# Patient Record
Sex: Female | Born: 1955
Health system: Southern US, Community
[De-identification: ages and names within clinical notes are randomized; demographics above are authoritative.]

## PROBLEM LIST (undated history)

## (undated) DIAGNOSIS — H9193 Unspecified hearing loss, bilateral: Secondary | ICD-10-CM

## (undated) HISTORY — PX: BREAST ENHANCEMENT SURGERY: SHX7

---

## 2019-06-07 ENCOUNTER — Encounter (HOSPITAL_COMMUNITY): Payer: Self-pay

## 2019-06-07 DIAGNOSIS — C210 Malignant neoplasm of anus, unspecified: Secondary | ICD-10-CM

## 2019-06-07 DIAGNOSIS — E785 Hyperlipidemia, unspecified: Secondary | ICD-10-CM

## 2019-06-07 HISTORY — PX: OSTOMY: SHX5997

## 2019-06-07 HISTORY — DX: Hyperlipidemia, unspecified: E78.5

## 2019-06-07 HISTORY — DX: Malignant neoplasm of anus, unspecified: C21.0

## 2019-06-10 ENCOUNTER — Inpatient Hospital Stay (HOSPITAL_COMMUNITY): Payer: BC Managed Care – PPO | Attending: Hematology | Admitting: Hematology

## 2019-06-10 ENCOUNTER — Encounter (HOSPITAL_COMMUNITY): Payer: Self-pay | Admitting: Hematology

## 2019-06-10 ENCOUNTER — Other Ambulatory Visit: Payer: Self-pay

## 2019-06-10 ENCOUNTER — Inpatient Hospital Stay (HOSPITAL_COMMUNITY): Payer: BC Managed Care – PPO

## 2019-06-10 VITALS — BP 112/70 | HR 73 | Temp 97.3°F | Resp 18 | Ht 67.25 in | Wt 144.2 lb

## 2019-06-10 DIAGNOSIS — R61 Generalized hyperhidrosis: Secondary | ICD-10-CM | POA: Insufficient documentation

## 2019-06-10 DIAGNOSIS — Z9221 Personal history of antineoplastic chemotherapy: Secondary | ICD-10-CM | POA: Insufficient documentation

## 2019-06-10 DIAGNOSIS — L03311 Cellulitis of abdominal wall: Secondary | ICD-10-CM | POA: Insufficient documentation

## 2019-06-10 DIAGNOSIS — C210 Malignant neoplasm of anus, unspecified: Secondary | ICD-10-CM | POA: Diagnosis present

## 2019-06-10 DIAGNOSIS — E785 Hyperlipidemia, unspecified: Secondary | ICD-10-CM | POA: Diagnosis not present

## 2019-06-10 DIAGNOSIS — Z7289 Other problems related to lifestyle: Secondary | ICD-10-CM | POA: Insufficient documentation

## 2019-06-10 DIAGNOSIS — K6389 Other specified diseases of intestine: Secondary | ICD-10-CM | POA: Diagnosis not present

## 2019-06-10 DIAGNOSIS — Z23 Encounter for immunization: Secondary | ICD-10-CM | POA: Insufficient documentation

## 2019-06-10 DIAGNOSIS — Z79890 Hormone replacement therapy (postmenopausal): Secondary | ICD-10-CM | POA: Insufficient documentation

## 2019-06-10 DIAGNOSIS — C7982 Secondary malignant neoplasm of genital organs: Secondary | ICD-10-CM

## 2019-06-10 DIAGNOSIS — Z87891 Personal history of nicotine dependence: Secondary | ICD-10-CM | POA: Diagnosis not present

## 2019-06-10 DIAGNOSIS — C211 Malignant neoplasm of anal canal: Secondary | ICD-10-CM | POA: Insufficient documentation

## 2019-06-10 DIAGNOSIS — L0889 Other specified local infections of the skin and subcutaneous tissue: Secondary | ICD-10-CM

## 2019-06-10 DIAGNOSIS — Z0000 Encounter for general adult medical examination without abnormal findings: Secondary | ICD-10-CM

## 2019-06-10 LAB — CBC WITH DIFFERENTIAL/PLATELET
Abs Immature Granulocytes: 0.01 10*3/uL (ref 0.00–0.07)
Basophils Absolute: 0 10*3/uL (ref 0.0–0.1)
Basophils Relative: 1 %
Eosinophils Absolute: 0.2 10*3/uL (ref 0.0–0.5)
Eosinophils Relative: 4 %
HCT: 39.5 % (ref 36.0–46.0)
Hemoglobin: 13.3 g/dL (ref 12.0–15.0)
Immature Granulocytes: 0 %
Lymphocytes Relative: 26 %
Lymphs Abs: 1.1 10*3/uL (ref 0.7–4.0)
MCH: 33.3 pg (ref 26.0–34.0)
MCHC: 33.7 g/dL (ref 30.0–36.0)
MCV: 99 fL (ref 80.0–100.0)
Monocytes Absolute: 0.4 10*3/uL (ref 0.1–1.0)
Monocytes Relative: 10 %
Neutro Abs: 2.6 10*3/uL (ref 1.7–7.7)
Neutrophils Relative %: 59 %
Platelets: 193 10*3/uL (ref 150–400)
RBC: 3.99 MIL/uL (ref 3.87–5.11)
RDW: 12.5 % (ref 11.5–15.5)
WBC: 4.3 10*3/uL (ref 4.0–10.5)
nRBC: 0 % (ref 0.0–0.2)

## 2019-06-10 LAB — COMPREHENSIVE METABOLIC PANEL
ALT: 17 U/L (ref 0–44)
AST: 18 U/L (ref 15–41)
Albumin: 4.3 g/dL (ref 3.5–5.0)
Alkaline Phosphatase: 53 U/L (ref 38–126)
Anion gap: 8 (ref 5–15)
BUN: 19 mg/dL (ref 8–23)
CO2: 26 mmol/L (ref 22–32)
Calcium: 8.9 mg/dL (ref 8.9–10.3)
Chloride: 101 mmol/L (ref 98–111)
Creatinine, Ser: 0.58 mg/dL (ref 0.44–1.00)
GFR calc Af Amer: >60 mL/min (ref >60)
GFR calc non Af Amer: >60 mL/min (ref >60)
Glucose, Bld: 93 mg/dL (ref 70–99)
Potassium: 4.1 mmol/L (ref 3.5–5.1)
Sodium: 135 mmol/L (ref 135–145)
Total Bilirubin: 0.8 mg/dL (ref 0.3–1.2)
Total Protein: 7.3 g/dL (ref 6.5–8.1)

## 2019-06-10 LAB — TROPONIN I (HIGH SENSITIVITY): Troponin I (High Sensitivity): 4 ng/L (ref <18)

## 2019-06-10 LAB — TSH: TSH: 1.597 u[IU]/mL (ref 0.350–4.500)

## 2019-06-10 MED ORDER — INFLUENZA VAC SPLIT QUAD 0.5 ML IM SUSY
0.5000 mL | PREFILLED_SYRINGE | Freq: Once | INTRAMUSCULAR | Status: AC
  Administered 2019-06-10: 13:00:00 0.5 mL via INTRAMUSCULAR
  Filled 2019-06-10: qty 0.5

## 2019-06-10 NOTE — Patient Instructions (Signed)
[place] at [facility] Discharge Instructions  You were seen today by Dr. Delton Coombes. He went over your history, family history and how you've been feeling lately. He will see you back according to your medical records for follow up.   Thank you for choosing [place] at [facility] to provide your oncology and hematology care.  To afford each patient quality time with our provider, please arrive at least 15 minutes before your scheduled appointment time.   If you have a lab appointment with the [facility] please come in thru the  Main Entrance and check in at the main information desk  You need to re-schedule your appointment should you arrive 10 or more minutes late.  We strive to give you quality time with our providers, and arriving late affects you and other patients whose appointments are after yours.  Also, if you no show three or more times for appointments you may be dismissed from the clinic at the providers discretion.     Again, thank you for choosing [facility].  Our hope is that these requests will decrease the amount of time that you wait before being seen by our physicians.       _____________________________________________________________  Should you have questions after your visit to [facility], please contact our office at [phone] between the hours of 8:00 a.m. and 4:30 p.m.  Voicemails left after 4:00 p.m. will not be returned until the following business day.  For prescription refill requests, have your pharmacy contact our office and allow 72 hours.    Cancer Center Support Programs:   > Cancer Support Group  2nd Tuesday of the month 1pm-2pm, Journey Room

## 2019-06-10 NOTE — Assessment & Plan Note (Signed)
1.  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 -Underwent colostomy reversal on 02/11/2019. -CT CAP on 03/18/2019 showed no evidence of metastatic disease in the chest.  Tiny hypodensities in the liver representing cysts.  No suspicious lymphadenopathy.  Surgical anastomosis present at the junction of the sigmoid colon and left descending colon. -She does not have any palpable adenopathy at this time. -I have recommended anoscopy every 6 to 12 months. -I have also recommended labs including CBC, CMP, troponin, ACTH, T3, free T4 and TSH as part of study protocol. -Follow-up appointments will be every 3 months for 2 years from study registration (which was March 2020), then every 6 months years 3-5. -We will schedule her next visit in 3 months with a PET CT scan in March 2021.  We will also try to obtain the protocol requirements for scans.  2.  Wound infection at the colostomy reversal site: -Left lower quadrant area is slightly erythematous.  There is very small clear discharge. -She reportedly stopped at the urgent care in [facility].  They initially put her on some antibiotic after obtaining cultures.  They called back yesterday and changed her antibiotic to Bactrim twice daily. -I am assuming this is MRSA.  She reports that she had 4 episodes of recurrent infection. -If there is no improvement, will consider referring to ID.

## 2019-06-10 NOTE — Progress Notes (Signed)
[facility] CONSULT NOTE  Patient Care Team: Patient, No Pcp Per as PCP - General (General Practice)  CHIEF COMPLAINTS/PURPOSE OF CONSULTATION:  Anal cancer follow-up.  HISTORY OF PRESENTING ILLNESS:  Courtney Gallegos 63 y.o. female is seen in consultation today for further work-up and management of squamous cell carcinoma of the anus.  She was diagnosed in October 2019 in [place], [place].  Symptoms at presentation include constipation, some minor bleeding.  She was treated with a combination chemoradiation therapy.  As part of her clinical trial, she also received 6 cycles of Opdivo as her tumor was PD-L1 positive.  She recently moved to [facility] because her daughter and grandkids live here.  She wants to establish care for her cancer history.  She denies any fevers.  She does have night sweats and uses testosterone injections.  She lost 25 pounds and was trying to lose weight.  While she was on her way to [facility], she had developed infection in the colostomy reversal area in the left anterior abdominal wall.  She stopped at an urgent care in [facility] and was given an antibiotic after cultures were taken.  She received a phone call from them yesterday and her antibiotic is changed to Bactrim DS twice daily.  She reports that she had the same skin infection about 4 times since the surgery.  She is currently retired.  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  She was an ex-smoker, who quit long time ago.  Family history is only significant for father who had tumor in kidney for years.  She does report some diarrhea since she is taking antibiotics.  Appetite is 100%.  Energy levels are present.  No pain reported.  MEDICAL HISTORY:  Past Medical History:  Diagnosis Date  . Anal cancer ([place]) 06/07/2019  . Hyperlipidemia 06/07/2019    SURGICAL HISTORY: Past Surgical History:  Procedure Laterality Date  . OSTOMY Left 06/07/2019     SOCIAL HISTORY: Social History   Socioeconomic History  . Marital status: Married    Spouse name: Not on file  . Number of children: 3  . Years of education: Not on file  . Highest education level: Not on file  Occupational History  . Occupation: Retired  Tobacco Use  . Smoking status: Never Smoker  . Smokeless tobacco: Never Used  Substance and Sexual Activity  . Alcohol use: Yes    Alcohol/week: 2.0 - 4.0 standard drinks    Types: 1 - 2 Glasses of wine, 1 - 2 Shots of liquor per week    Comment: daily   . Drug use: Never  . Sexual activity: Yes  Other Topics Concern  . Not on file  Social History Narrative  . Not on file   Social Determinants of Health   Financial Resource Strain: Low Risk   . Difficulty of Paying Living Expenses: Not hard at all  Food Insecurity: No Food Insecurity  . Worried About Charity fundraiser in the Last Year: Never true  . Ran Out of Food in the Last Year: Never true  Transportation Needs: No Transportation Needs  . Lack of Transportation (Medical): No  . Lack of Transportation (Non-Medical): No  Physical Activity: Inactive  . Days of Exercise per Week: 0 days  . Minutes of Exercise per Session: 0 min  Stress: No Stress Concern Present  . Feeling of Stress : Not at all  Social Connections: Slightly Isolated  .  Frequency of Communication with Friends and Family: More than three times a week  . Frequency of Social Gatherings with Friends and Family: Never  . Attends Religious Services: More than 4 times per year  . Active Member of Clubs or Organizations: No  . Attends Archivist Meetings: Never  . Marital Status: Married  Human resources officer Violence: Not At Risk  . Fear of Current or Ex-Partner: No  . Emotionally Abused: No  . Physically Abused: No  . Sexually Abused: No    FAMILY HISTORY: Family History  Problem Relation Age of Onset  . Cancer Father     ALLERGIES:  has no allergies on file.  MEDICATIONS:   Current Outpatient Medications  Medication Sig Dispense Refill  . atorvastatin (LIPITOR) 10 MG tablet Take 10 mg by mouth daily.    . calcium carbonate (OS-CAL - DOSED IN MG OF ELEMENTAL CALCIUM) 1250 (500 Ca) MG tablet Take 1 tablet by mouth.    . ergocalciferol (VITAMIN D2) 1.25 MG (50000 UT) capsule Take 50,000 Units by mouth once a week.    . Multiple Vitamin (MULTIVITAMIN WITH MINERALS) TABS tablet Take 1 tablet by mouth daily.    Marland Kitchen testosterone cypionate (DEPOTESTOSTERONE CYPIONATE) 200 MG/ML injection Inject into the muscle every 30 (thirty) days.     No current facility-administered medications for this visit.    REVIEW OF SYSTEMS:   Constitutional: Denies fevers, chills or abnormal night sweats Eyes: Denies blurriness of vision, double vision or watery eyes Ears, nose, mouth, throat, and face: Denies mucositis or sore throat Respiratory: Denies cough, dyspnea or wheezes Cardiovascular: Denies palpitation, chest discomfort or lower extremity swelling Gastrointestinal:  Denies nausea, heartburn or change in bowel habits Skin: Denies abnormal skin rashes Lymphatics: Denies new lymphadenopathy or easy bruising Neurological:Denies numbness, tingling or new weaknesses Behavioral/Psych: Mood is stable, no new changes  All other systems were reviewed with the patient and are negative.  PHYSICAL EXAMINATION: ECOG PERFORMANCE STATUS: 0 - Asymptomatic  Vitals:   06/10/19 1309  BP: 112/70  Pulse: 73  Resp: 18  Temp: (!) 97.3 F (36.3 C)  SpO2: 99%   Filed Weights   06/10/19 1309  Weight: 144 lb 3.2 oz (65.4 kg)    GENERAL:alert, no distress and comfortable SKIN: skin color, texture, turgor are normal, no rashes or significant lesions EYES: normal, conjunctiva are pink and non-injected, sclera clear OROPHARYNX:no exudate, no erythema and lips, buccal mucosa, and tongue normal  NECK: supple, thyroid normal size, non-tender, without nodularity LYMPH:  no palpable  lymphadenopathy in the cervical, axillary or inguinal LUNGS: clear to auscultation and percussion with normal breathing effort HEART: regular rate & rhythm and no murmurs and no lower extremity edema ABDOMEN:abdomen soft, non-tender and normal bowel sounds.  In the left quadrant, there is mild erythema and discharge at the injection site. Musculoskeletal:no cyanosis of digits and no clubbing  PSYCH: alert & oriented x 3 with fluent speech NEURO: no focal motor/sensory deficits  LABORATORY DATA:  I have reviewed the data as listed Lab Results  Component Value Date   WBC 4.3 06/10/2019   HGB 13.3 06/10/2019   HCT 39.5 06/10/2019   MCV 99.0 06/10/2019   PLT 193 06/10/2019     Chemistry      Component Value Date/Time   NA 135 06/10/2019 1422   K 4.1 06/10/2019 1422   CL 101 06/10/2019 1422   CO2 26 06/10/2019 1422   BUN 19 06/10/2019 1422   CREATININE 0.58 06/10/2019 1422  Component Value Date/Time   CALCIUM 8.9 06/10/2019 1422   ALKPHOS 53 06/10/2019 1422   AST 18 06/10/2019 1422   ALT 17 06/10/2019 1422   BILITOT 0.8 06/10/2019 1422       RADIOGRAPHIC STUDIES: I have personally reviewed the radiological images as listed and agreed with the findings in the report.  ASSESSMENT & PLAN:  Cancer of anal canal ([place]) 1.  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 -Underwent colostomy reversal on 02/11/2019. -CT CAP on 03/18/2019 showed no evidence of metastatic disease in the chest.  Tiny hypodensities in the liver representing cysts.  No suspicious lymphadenopathy.  Surgical anastomosis present at the junction of the sigmoid colon and left descending colon. -She does not have any palpable adenopathy at this  time. -I have recommended anoscopy every 6 to 12 months. -I have also recommended labs including CBC, CMP, troponin, ACTH, T3, free T4 and TSH as part of study protocol. -Follow-up appointments will be every 3 months for 2 years from study registration (which was March 2020), then every 6 months years 3-5. -We will schedule her next visit in 3 months with a PET CT scan in March 2021.  We will also try to obtain the protocol requirements for scans.  2.  Wound infection at the colostomy reversal site: -Left lower quadrant area is slightly erythematous.  There is very small clear discharge. -She reportedly stopped at the urgent care in [facility].  They initially put her on some antibiotic after obtaining cultures.  They called back yesterday and changed her antibiotic to Bactrim twice daily. -I am assuming this is MRSA.  She reports that she had 4 episodes of recurrent infection. -If there is no improvement, will consider referring to ID.  Orders Placed This Encounter  Procedures  . NM PET Image Restag (PS) Skull Base To Thigh    Standing Status:   Future    Standing Expiration Date:   06/09/2020    Order Specific Question:   ** REASON FOR EXAM (FREE TEXT)    Answer:   anal cancer    Order Specific Question:   If indicated for the ordered procedure, I authorize the administration of a radiopharmaceutical per Radiology protocol    Answer:   Yes    Order Specific Question:   Preferred imaging location?    Answer:   [facility]    Order Specific Question:   Radiology Contrast Protocol - do NOT remove file path    Answer:   [email or web address]  . CBC with Differential/Platelet    Standing Status:   Future    Standing Expiration Date:   06/09/2020  . Comprehensive metabolic panel    Standing Status:   Future    Standing Expiration Date:   06/09/2020  . TSH    Standing Status:   Future    Standing Expiration Date:   06/09/2020  . T3    Standing Status:    Future    Standing Expiration Date:   06/09/2020  . T4    Standing Status:   Future    Standing Expiration Date:   06/09/2020  . CBC with Differential/Platelet    Standing Status:   Future    Number of Occurrences:   1  Standing Expiration Date:   06/09/2020  . Comprehensive metabolic panel    Standing Status:   Future    Number of Occurrences:   1    Standing Expiration Date:   06/09/2020  . ACTH    Standing Status:   Future    Number of Occurrences:   1    Standing Expiration Date:   06/09/2020  . TSH    Standing Status:   Future    Number of Occurrences:   1    Standing Expiration Date:   06/09/2020  . T3    Standing Status:   Future    Number of Occurrences:   1    Standing Expiration Date:   06/09/2020  . T4    Standing Status:   Future    Number of Occurrences:   1    Standing Expiration Date:   06/09/2020    All questions were answered. The patient knows to call the clinic with any problems, questions or concerns.      Derek Jack, MD 06/10/2019 7:06 PM

## 2019-06-10 NOTE — Progress Notes (Signed)
I met with patient today during the visit with Dr. Delton Coombes.  I explained to her how I am involved in her care.  I provided my contact information so that she can contact me if she has any questions or concerns.  She voices appreciation for such great care.

## 2019-06-11 ENCOUNTER — Encounter (INDEPENDENT_AMBULATORY_CARE_PROVIDER_SITE_OTHER): Payer: Self-pay | Admitting: Gastroenterology

## 2019-06-11 LAB — T3: T3, Total: 125 ng/dL (ref 71–180)

## 2019-06-11 LAB — ACTH: C206 ACTH: 14.7 pg/mL (ref 7.2–63.3)

## 2019-06-11 LAB — T4: T4, Total: 5.7 ug/dL (ref 4.5–12.0)

## 2019-07-22 ENCOUNTER — Ambulatory Visit (INDEPENDENT_AMBULATORY_CARE_PROVIDER_SITE_OTHER): Payer: Self-pay | Admitting: Gastroenterology

## 2019-08-26 ENCOUNTER — Telehealth (HOSPITAL_COMMUNITY): Payer: Self-pay | Admitting: Hematology

## 2019-08-26 NOTE — Telephone Encounter (Signed)
Pt called upset wanting to speak to a Practice Admin regarding the bill she received from her visit on 06/10/2019. Pt states she was only here to meet Dr Delton Coombes ONLY. She states she did not know she was going to be ordered "all kinds of test and scans". She says she agreed to a flu shot and that was it. I asked pt why she allowed the lab tech to draw her labs? She stated she did not know what they were for and did not know how expensive they would be. She stated her new insurance plan is effective today and back in December he plan had a $8500 ded. I advised her that from what I could tell her balance is from her deductible. She questioned the facility charge and I explained that to her as well. Pt is upset I will escalate this to our AD-Haley Glennon Hamilton

## 2019-08-26 NOTE — Progress Notes (Signed)
[facility]  [address] [place], [place] [address] Phone: [phone]  Fax: [phone]   Clinic Day:  08/29/2019  Referring physician: No ref. provider found  Chief Complaint: Courtney Gallegos is a 64 y.o. female with stage III anal squamous cell carcinoma who is seen for new patient assessment.   HPI: The patient was diagnosed with anal squamous cell carcinoma of the perineum in 2019. She presented with constipation and minor rectal bleeding in 01/2018 during a physical exam with her OB/GYN.  Ultrasound on 02/15/2018 showed a 2.7 x 3.3 x 3.2 cm complex perineal mass. Biopsy on 04/09/2018 revealed moderately differentiated squamous cell carcinoma, P16+.   MRI of the pelvis on 03/25/2018 revealed a 3.2 cm palpable perineal mass invading the distal one third of the posterior vaginal wall extending to the introitus, the adjacent vaginal rectal perineum and the anterior anus extending in close proximity to the rectal anal junction craniocaudally to the introitus and appeared to extend from the 9:00 to 3 o'clock position.  Differential included anal carcinoma or vaginal carcinoma origin.  There was no adenopathy or metastatic disease.  PET scan on 05/17/2018 revealed focal anal rectal uptake consistent with her history of malignancy.  There was no hypermetabolic lymph nodes in the abdomen or pelvis.  There was no evidence of metastatic disease.  She underwent laparoscopic diverting colostomy on 06/05/2018.  She received 2 cycles of capecitabine + mitomycin (07/02/2018 - 07/30/2018) with concurrent radiation  (07/02/2018 - 08/10/2018).  She enrolled on clinical trial [id number] available through the [facility]:  A randomized phase 2 study of nivolumab after combined modality therapy in high risk anal cancer.  Treatment included nivolumab 480 mg IV q. 4 weeks for 6 cycles or observation.  She received 4 cycles of nivolumab (09/19/2018 -  12/12/2018).  She states post trial she undergoes PET scan every 6 months.  PET scan on 12/24/2018 showed no evidence of disease.  Fluoroscopy barium enema on 01/09/2019 showed no rectovaginal fistula.  There was diverticulosis involving the residual sigmoid colon.  She underwent colostomy reversal on 02/11/2019.   Chest, abdomen and pelvis CT on 03/18/2019 showed no evidence of disease recurrence.  Her next PET scan will be in 08/2019.    Post colostomy reversal, she had had 4 infections of her anterior left abdominal wall as her wound healed by secondary intention.  While on her way to [place], [place] from [place], [place], she developed an infection at the colostomy reversal site.  She stopped at an urgent care in [place], [place].  She was placed on antibiotics then switched to Bactrim on 06/09/2019 when cultures were available.  She was initially seen by Dr Derek Jack in oncology on 06/10/2019.  She decided to transfer care to the [facility].  Symptomatically, she feels "fine". She tolerated radiation and chemotherapy well.  She had no mouth sores, redness or tenderness of her hands and feet. She had lost some hair and is still loosing some hair. She had a change in taste; she did not like the taste of bread. She notes she had "chemo brain". Towards the end of radiation, she had some skin break downs on her perineum.  If she eats foods high in acidity she has a lot of irritation on her bottom.   She does not exercise as much; she feels tired. She would like to start back exercising. She denies any B symptoms. She had her first COVID vaccine on 08/22/2019 with  associated headaches and muscle aches.  She has a cough related to allergies. She has pain in her lower abdomen s/p colostomy reversal.  She would like her endoscopies performed by the surgeon recommended by her surgeon in [place].  She also wants her surgeon to take care of all of her GI related issues.   Her stool is narrow and  softer than ususal. She has small daily bowel movements. There is no blood in her stool but there is occasional mucus. She has no urinary concerns but she notes some leakage. She has no skin changes. She denies any neurological changes. She feel sore below her waist because she was unable to complete physical therapy due to the pandemic. She notes perineal muscle weakness.   She would like for her PET scan to be scheduled at the end on this month. She will be out of town from 09/08/2019 to 09/19/2019.   She received her first COVID-19 vaccine on 08/22/2019.   Past Medical History:  Diagnosis Date   Anal cancer ([place]) 06/07/2019   Hyperlipidemia 06/07/2019    Past Surgical History:  Procedure Laterality Date   OSTOMY Left 06/07/2019    Family History  Problem Relation Age of Onset   Cancer Father     Social History:  reports that she has never smoked. She has never used smokeless tobacco. She reports current alcohol use of about 2.0 - 4.0 standard drinks of alcohol per week. She reports that she does not use drugs. She is currently retired.  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 She was an ex-smoker, who quit long time ago when she was a teenager. She denies any exposure to radiation outside of treatment. Sh has no exposure to toxins. She drinks a couple times a week.  Patient used to live in [place], [place]. She lives in [place], [place]. Her daughter and grandchildren liver in [place], [place].  The patient is alone today.  Allergies: Not on File  Current Medications: Current Outpatient Medications  Medication Sig Dispense Refill   atorvastatin (LIPITOR) 10 MG tablet Take 10 mg by mouth daily.     calcium carbonate (OS-CAL - DOSED IN MG OF ELEMENTAL CALCIUM) 1250 (500 Ca) MG tablet Take 1 tablet by mouth.     ergocalciferol (VITAMIN D2) 1.25 MG (50000 UT) capsule Take 50,000 Units by mouth once a week.     Multiple Vitamin (MULTIVITAMIN WITH MINERALS) TABS tablet  Take 1 tablet by mouth daily.     testosterone cypionate (DEPOTESTOSTERONE CYPIONATE) 200 MG/ML injection Inject into the muscle every 30 (thirty) days.     No current facility-administered medications for this visit.    Review of Systems  Constitutional: Positive for malaise/fatigue. Negative for chills, diaphoresis, fever and weight loss.       Feels "fine".  HENT: Negative.  Negative for congestion, ear pain, hearing loss, nosebleeds, sinus pain, sore throat and tinnitus.   Eyes: Negative.  Negative for blurred vision, double vision and photophobia.  Respiratory: Positive for cough (allergies). Negative for hemoptysis, sputum production and shortness of breath.   Cardiovascular: Negative.  Negative for chest pain, palpitations and leg swelling.  Gastrointestinal: Negative for abdominal pain (lower abdomen discomfort), blood in stool, constipation, diarrhea, heartburn, melena, nausea and vomiting.       Stool is narrow and softer. Bowel movement daily. Occasional mucus in stool.  Genitourinary: Negative for dysuria, frequency, hematuria and urgency.       Perineal weakness.  Some urinary leakage.  Musculoskeletal: Positive for myalgias (muscle aches after COVID vaccine 1 week ago). Negative for back pain, joint pain and neck pain.  Skin: Negative for itching and rash (in rectal area depends on the food she eats).       Last staph infection at site of colostomy reversal in 05/2019.  Neurological: Negative for dizziness, tingling, sensory change, speech change, focal weakness, weakness and headaches (1 week ago related to COVID vaccine).       "Chemo brain".  Endo/Heme/Allergies: Positive for environmental allergies. Does not bruise/bleed easily.  Psychiatric/Behavioral: Negative.  Negative for depression and memory loss. The patient is not nervous/anxious and does not have insomnia.   All other systems reviewed and are negative.  Performance status (ECOG): 0-1  Vitals Blood pressure  108/64, pulse 73, temperature (!) 97.3 F (36.3 C), temperature source Tympanic, resp. rate 18, height 5' 7.25" (1.708 m), weight 147 lb 4.3 oz (66.8 kg), SpO2 96 %.   Physical Exam  Constitutional: She is oriented to person, place, and time. She appears well-developed and well-nourished. No distress.  HENT:  Head: Atraumatic.  Mouth/Throat: Oropharynx is clear and moist. No oropharyngeal exudate.  Long gray hair.  Mask.  Eyes: Pupils are equal, round, and reactive to light. Conjunctivae and EOM are normal. No scleral icterus.  Glasses.  Blue eyes.  Cardiovascular: Normal rate, regular rhythm and normal heart sounds.  No murmur heard. Pulmonary/Chest: Effort normal and breath sounds normal. No respiratory distress. She has no wheezes. She has no rales. She exhibits no tenderness.  Abdominal: Soft. Bowel sounds are normal. She exhibits no distension and no mass. There is no abdominal tenderness. There is no rebound and no guarding.  Left lower quadrant well healed s/p ostomy reversal.  Musculoskeletal:        General: No tenderness or edema. Normal range of motion.     Cervical back: Normal range of motion and neck supple.  Lymphadenopathy:       Head (right side): No preauricular, no posterior auricular and no occipital adenopathy present.       Head (left side): No preauricular, no posterior auricular and no occipital adenopathy present.    She has no cervical adenopathy.    She has no axillary adenopathy.       Right: No inguinal and no supraclavicular adenopathy present.       Left: No inguinal and no supraclavicular adenopathy present.  Neurological: She is alert and oriented to person, place, and time.  Skin: Skin is warm and dry. No rash noted. She is not diaphoretic. No erythema. No pallor.  Psychiatric: She has a normal mood and affect. Her behavior is normal. Judgment and thought content normal.  Nursing note and vitals reviewed.   Imaging studies: 02/15/2018:  Ultrasound  revealed a 2.7 x 3.3 x 3.2 cm complex perineal mass.  03/25/2018:  MRI of the pelvis revealed a 3.2 cm palpable perineal mass invading the distal one third of the posterior vaginal wall extending to the introitus, the adjacent vaginal rectal perineum and the anterior anus extending in close proximity to the rectal anal junction craniocaudally to the introitus and appeared to extend from the 9:00 to 3 o'clock position.  Differential included anal carcinoma or vaginal carcinoma origin.  There was no adenopathy or metastatic disease. 05/17/2018:  PET scan revealed focal anal rectal uptake consistent with her history of malignancy.  There was no hypermetabolic lymph nodes in the abdomen or pelvis.  There was no evidence of metastatic disease. 12/24/2018:  PET scan revealed no evidence of disease.   01/09/2019:  Fluoroscopy barium enema revealed no rectovaginal fistula.  There was diverticulosis involving the residual sigmoid colon.   03/18/2019:  Chest, abdomen and pelvis CT revealed no evidence of disease recurrence. ss or tenderness of her hands and feet. She had lost some hair and    No visits with results within 3 Day(s) from this visit.  Latest known visit with results is:  Appointment on 06/10/2019  Component Date Value Ref Range Status   T4, Total 06/10/2019 5.7  4.5 - 12.0 ug/dL Final   Comment: (NOTE) Performed At: [facility] [place], [place] [id number] Rush Farmer MD [id number]    T3, Total 06/10/2019 125  71 - 180 ng/dL Final   Comment: (NOTE) Performed At: [facility] [place], [place] [id number] Rush Farmer MD [id number]    TSH 06/10/2019 1.597  0.350 - 4.500 uIU/mL Final   Comment: Performed by a 3rd Generation assay with a functional sensitivity of <=0.01 uIU/mL. Performed at [facility], [address], [place], [place] [address]    Troponin I (High Sensitivity) 06/10/2019 4  <18 ng/L Final   Comment:  (NOTE) Elevated high sensitivity troponin I (hsTnI) values and significant  changes across serial measurements may suggest ACS but many other  chronic and acute conditions are known to elevate hsTnI results.  Refer to the "Links" section for chest pain algorithms and additional  guidance. Performed at [facility], [address], [place], [place] [address]    C206 ACTH 06/10/2019 14.7  7.2 - 63.3 pg/mL Final   Comment: (NOTE) ACTH reference interval for samples collected between 7 and 10 AM. Performed At: [facility] [place], [place] [id number] Rush Farmer MD [id number]    Sodium 06/10/2019 135  135 - 145 mmol/L Final   Potassium 06/10/2019 4.1  3.5 - 5.1 mmol/L Final   Chloride 06/10/2019 101  98 - 111 mmol/L Final   CO2 06/10/2019 26  22 - 32 mmol/L Final   Glucose, Bld 06/10/2019 93  70 - 99 mg/dL Final   BUN 06/10/2019 19  8 - 23 mg/dL Final   Creatinine, Ser 06/10/2019 0.58  0.44 - 1.00 mg/dL Final   Calcium 06/10/2019 8.9  8.9 - 10.3 mg/dL Final   Total Protein 06/10/2019 7.3  6.5 - 8.1 g/dL Final   Albumin 06/10/2019 4.3  3.5 - 5.0 g/dL Final   AST 06/10/2019 18  15 - 41 U/L Final   ALT 06/10/2019 17  0 - 44 U/L Final   Alkaline Phosphatase 06/10/2019 53  38 - 126 U/L Final   Total Bilirubin 06/10/2019 0.8  0.3 - 1.2 mg/dL Final   GFR calc non Af Amer 06/10/2019 >60  >60 mL/min Final   GFR calc Af Amer 06/10/2019 >60  >60 mL/min Final   Anion gap 06/10/2019 8  5 - 15 Final   Performed at [facility], [address] [place], [place] [address]   WBC 06/10/2019 4.3  4.0 - 10.5 K/uL Final   RBC 06/10/2019 3.99  3.87 - 5.11 MIL/uL Final   Hemoglobin 06/10/2019 13.3  12.0 - 15.0 g/dL Final   HCT 06/10/2019 39.5  36.0 - 46.0 % Final   MCV 06/10/2019 99.0  80.0 - 100.0 fL Final   MCH 06/10/2019 33.3  26.0 - 34.0 pg Final   MCHC 06/10/2019 33.7  30.0 - 36.0 g/dL Final   RDW 06/10/2019 12.5  11.5 -  15.5 % Final    Platelets 06/10/2019 193  150 - 400 K/uL Final   nRBC 06/10/2019 0.0  0.0 - 0.2 % Final   Neutrophils Relative % 06/10/2019 59  % Final   Neutro Abs 06/10/2019 2.6  1.7 - 7.7 K/uL Final   Lymphocytes Relative 06/10/2019 26  % Final   Lymphs Abs 06/10/2019 1.1  0.7 - 4.0 K/uL Final   Monocytes Relative 06/10/2019 10  % Final   Monocytes Absolute 06/10/2019 0.4  0.1 - 1.0 K/uL Final   Eosinophils Relative 06/10/2019 4  % Final   Eosinophils Absolute 06/10/2019 0.2  0.0 - 0.5 K/uL Final   Basophils Relative 06/10/2019 1  % Final   Basophils Absolute 06/10/2019 0.0  0.0 - 0.1 K/uL Final   Immature Granulocytes 06/10/2019 0  % Final   Abs Immature Granulocytes 06/10/2019 0.01  0.00 - 0.07 K/uL Final   Performed at [facility], [address], [place], [place] [address]    Assessment:  Courtney Gallegos is a 64 y.o. female with stage III anal squamous cell carcinoma s/p biopsy on 04/09/2018.  Pathology revealed a moderately differentiated squamous cell carcinoma, P16+.   MRI of the pelvis on 03/25/2018 revealed a 3.2 cm palpable perineal mass invading the distal one third of the posterior vaginal wall extending to the introitus, the adjacent vaginal rectal perineum and the anterior anus extending in close proximity to the rectal anal junction craniocaudally to the introitus and appeared to extend from the 9:00 to 3 o'clock position.   PET scan on 05/17/2018 revealed focal anal rectal uptake consistent with her history of malignancy.  There was no hypermetabolic lymph nodes in the abdomen or pelvis.  There was no evidence of metastatic disease.  She underwent laparoscopic diverting colostomy on 06/05/2018.  She received 2 cycles of capecitabine + mitomycin (07/02/2018 - 07/30/2018) with concurrent radiation  (07/02/2018 - 08/10/2018).  She underwent colostomy reversal on 02/11/2019.  She enrolled on clinical trial [id number] available through the [facility]:  A randomized  phase 2 study of nivolumab after combined modality therapy in high risk anal cancer.  She received 4 cycles of nivolumab (09/19/2018 - 12/12/2018).    Chest, abdomen and pelvis CT on 03/18/2019 revealed no evidence of disease recurrence.  Symptomatically, she feels "fine".  She notes some fatigue felt related to decreased exercise s/p the COVID-19 pandemic.  She describes "chemo brain". She has small daily bowel movements. She denies any blood in her stool but notes occasional mucus. She has some urinary leakage.  Exam reveals a well healed ostomy site.  She received her first COVID-19 vaccine on 08/22/2019.  Plan: 1.   Labs today:  CBC with diff, CMP, troponin, ACTH, TSH, free T4. 2.   Stage III anal squamous cell carcinoma  I reviewed her entire medical history, diagnosis and management of anal cell cancer.  We discussed her enrollment on clinical trial [id number].   I discussed follow-up with our clinical trials nurse Roger Shelter for clinical trial follow-up requirements.   She stated the she required a PET scan by the end of 08/2019.  We discussed need for follow-up endoscopies.   She requested follow-up with Dr Cinda Quest [phone]) recommended by her surgeon in [place].    Her surgeon in [place] is Dr Alda Lea [phone].    Her medical oncologist in [place] is Dr Venora Maples [phone] (#[phone]). 3.   Cognitive function s/p chemotherapy  Patient notes "chemo  brain".  Discuss follow-up with Roger Shelter regarding potential enrollment on the Remember study. 4.   Schedule PET scan on 09/20/2019. 5.   MD to talk to Roger Shelter re: current clinical trial and Remember study. 6.   MD to talk to Dr Cinda Quest (surgeon in [place]) re: endoscopies. 7.   RTC after PET scan (Doximity or WebEx) for MD assessment and review of studies.   I discussed the assessment and treatment plan with the patient.  The patient was provided an opportunity to ask questions and  all were answered.  The patient agreed with the plan and demonstrated an understanding of the instructions.  The patient was advised to call back if the symptoms worsen or if the condition fails to improve as anticipated.  I provided 33 minutes of face-to-face time during this this encounter and > 50% was spent counseling as documented under my assessment and plan.  An additional 15 minutes were spent reviewing her records.  Pattijo Juste C. Mike Gip, MD, PhD    08/29/2019, 3:16 PM  I, Selena Batten, am acting as scribe for [employer]. Mike Gip, MD, PhD.  I, Evonda Enge C. Mike Gip, MD, have reviewed the above documentation for accuracy and completeness, and I agree with the above.

## 2019-08-29 ENCOUNTER — Other Ambulatory Visit: Payer: Self-pay

## 2019-08-29 ENCOUNTER — Encounter: Payer: Self-pay | Admitting: Hematology and Oncology

## 2019-08-29 ENCOUNTER — Inpatient Hospital Stay: Payer: Medicare Other

## 2019-08-29 ENCOUNTER — Inpatient Hospital Stay: Payer: Medicare Other | Attending: Hematology and Oncology | Admitting: Hematology and Oncology

## 2019-08-29 VITALS — BP 108/64 | HR 73 | Temp 97.3°F | Resp 18 | Ht 67.25 in | Wt 147.3 lb

## 2019-08-29 DIAGNOSIS — Z87891 Personal history of nicotine dependence: Secondary | ICD-10-CM | POA: Insufficient documentation

## 2019-08-29 DIAGNOSIS — Z79899 Other long term (current) drug therapy: Secondary | ICD-10-CM | POA: Diagnosis not present

## 2019-08-29 DIAGNOSIS — C211 Malignant neoplasm of anal canal: Secondary | ICD-10-CM

## 2019-08-29 LAB — CBC WITH DIFFERENTIAL/PLATELET
Abs Immature Granulocytes: 0.01 10*3/uL (ref 0.00–0.07)
Basophils Absolute: 0 10*3/uL (ref 0.0–0.1)
Basophils Relative: 1 %
Eosinophils Absolute: 0.1 10*3/uL (ref 0.0–0.5)
Eosinophils Relative: 3 %
HCT: 39.2 % (ref 36.0–46.0)
Hemoglobin: 13.4 g/dL (ref 12.0–15.0)
Immature Granulocytes: 0 %
Lymphocytes Relative: 28 %
Lymphs Abs: 1.2 10*3/uL (ref 0.7–4.0)
MCH: 32.9 pg (ref 26.0–34.0)
MCHC: 34.2 g/dL (ref 30.0–36.0)
MCV: 96.3 fL (ref 80.0–100.0)
Monocytes Absolute: 0.4 10*3/uL (ref 0.1–1.0)
Monocytes Relative: 10 %
Neutro Abs: 2.6 10*3/uL (ref 1.7–7.7)
Neutrophils Relative %: 58 %
Platelets: 163 10*3/uL (ref 150–400)
RBC: 4.07 MIL/uL (ref 3.87–5.11)
RDW: 12.1 % (ref 11.5–15.5)
WBC: 4.4 10*3/uL (ref 4.0–10.5)
nRBC: 0 % (ref 0.0–0.2)

## 2019-08-29 LAB — COMPREHENSIVE METABOLIC PANEL
ALT: 22 U/L (ref 0–44)
AST: 20 U/L (ref 15–41)
Albumin: 4.4 g/dL (ref 3.5–5.0)
Alkaline Phosphatase: 43 U/L (ref 38–126)
Anion gap: 12 (ref 5–15)
BUN: 24 mg/dL — ABNORMAL HIGH (ref 8–23)
CO2: 24 mmol/L (ref 22–32)
Calcium: 9.1 mg/dL (ref 8.9–10.3)
Chloride: 103 mmol/L (ref 98–111)
Creatinine, Ser: 0.59 mg/dL (ref 0.44–1.00)
GFR calc Af Amer: >60 mL/min (ref >60)
GFR calc non Af Amer: >60 mL/min (ref >60)
Glucose, Bld: 104 mg/dL — ABNORMAL HIGH (ref 70–99)
Potassium: 3.9 mmol/L (ref 3.5–5.1)
Sodium: 139 mmol/L (ref 135–145)
Total Bilirubin: 0.4 mg/dL (ref 0.3–1.2)
Total Protein: 7.4 g/dL (ref 6.5–8.1)

## 2019-08-29 LAB — T4, FREE: Free T4: 0.7 ng/dL (ref 0.61–1.12)

## 2019-08-29 LAB — TROPONIN I (HIGH SENSITIVITY): Troponin I (High Sensitivity): 5 ng/L (ref <18)

## 2019-08-29 LAB — TSH: TSH: 1.456 u[IU]/mL (ref 0.350–4.500)

## 2019-09-02 ENCOUNTER — Encounter: Payer: Self-pay | Admitting: Hematology and Oncology

## 2019-09-09 ENCOUNTER — Inpatient Hospital Stay (HOSPITAL_COMMUNITY): Payer: BC Managed Care – PPO

## 2019-09-09 ENCOUNTER — Other Ambulatory Visit (HOSPITAL_COMMUNITY): Payer: Self-pay

## 2019-09-16 ENCOUNTER — Ambulatory Visit (HOSPITAL_COMMUNITY): Payer: Self-pay | Admitting: Hematology

## 2019-09-23 ENCOUNTER — Encounter
Admission: RE | Admit: 2019-09-23 | Discharge: 2019-09-23 | Disposition: A | Discharge status: Home / Self Care | Payer: Medicare Other | Source: Ambulatory Visit | Attending: Hematology and Oncology | Admitting: Hematology and Oncology

## 2019-09-23 ENCOUNTER — Other Ambulatory Visit: Payer: Self-pay

## 2019-09-23 DIAGNOSIS — C211 Malignant neoplasm of anal canal: Secondary | ICD-10-CM | POA: Diagnosis not present

## 2019-09-23 DIAGNOSIS — Z79899 Other long term (current) drug therapy: Secondary | ICD-10-CM | POA: Diagnosis not present

## 2019-09-23 LAB — GLUCOSE, CAPILLARY: Glucose-Capillary: 99 mg/dL (ref 70–99)

## 2019-09-23 MED ORDER — FLUDEOXYGLUCOSE F - 18 (FDG) INJECTION
7.6000 | Freq: Once | INTRAVENOUS | Status: AC | PRN
  Administered 2019-09-23: 7.98 via INTRAVENOUS

## 2019-09-24 NOTE — Progress Notes (Signed)
[facility]  [address], [place], [place] [address] Phone: [phone]  Fax: [phone]   Telemedicine Office Visit:  09/26/2019  Referring physician: No ref. provider found  I connected with Courtney Gallegos on 09/26/19 at 9:32 AM by videoconferencing and verified that I was speaking with the correct person using 2 identifiers.  The patient was at home.  I discussed the limitations, risk, security and privacy concerns of performing an evaluation and management service by videoconferencing and the availability of in person appointments.  I also discussed with the patient that there may be a patient responsible charge related to this service.  The patient expressed understanding and agreed to proceed.   Chief Complaint: Courtney Gallegos is a 64 y.o. female with stage III anal squamous cell carcinoma who is seen for re-assessment after recent PET scan.  HPI: The patient was last seen in the medical oncology clinic on 08/29/2019. At that time, she felt "fine". She noted some fatigue felt related to decreased exercise s/p the COVID-19 pandemic.  She described "chemo brain". She had small daily bowel movements. She denied any blood in her stool but noted occasional mucus. She had some urinary leakage. Exam revealed a well healed ostomy site.   Labs showed hematocrit 39.2, hematocrit 13.4, platelets 163,000, WBC 4,400. BUN was 24. Troponin I (high sensitivity) was 5. TSH was 1.456 with  free T4 0.70.   ACTH on 08/30/2019 was 19.4  PET scan on 09/23/2019 revealed no evidence of recurrent or metastatic disease.  There were hypermetabolic right/supraclavicular lymph nodes c/w COVID-19 vaccination 09/20/2019.  There was mild nonspecific hypermetabolism (SUV 3.2) within a 6 mm normal-sized subcarinal lymph node.   During the interim, the patient has felt "good". Patient received her last COVID-19 vaccine on 09/20/2019. She notes in order to continue participating in the study  she was told that she needed to be seen by providers at their facility and she did not want to to that.   She reports a good energy level. Patient continues to cough secondary to allergies. Her bowel are unchanged. Her urinary leakage is the same. Her thinking is fine but every once in a while she has trouble findings words during conversation.    Past Medical History:  Diagnosis Date  . Anal cancer ([place]) 06/07/2019  . Hyperlipidemia 06/07/2019    Past Surgical History:  Procedure Laterality Date  . OSTOMY Left 06/07/2019    Family History  Problem Relation Age of Onset  . Cancer Father     Social History:  reports that she has never smoked. She has never used smokeless tobacco. She reports current alcohol use of about 2.0 - 4.0 standard drinks of alcohol per week. She reports that she does not use drugs. She is currently retired.  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 She was an ex-smoker, who quit long time ago when she was a teenager. She denies any exposure to radiation outside of treatment. Sh has no exposure to toxins. She drinks a couple times a week.  Patient used to live in [place], [place]. She lives in [place], [place]. Her daughter and grandchildren liver in [place], [place].  The patient is accompanied by her husband, Mikki Santee, today.  Participants in the patient's visit and their role in the encounter included the patient, her husband, and Vito Berger, CMA, today.  The intake visit was provided by Vito Berger, CMA.   Allergies: Not on File  Current Medications: Current Outpatient  Medications  Medication Sig Dispense Refill  . atorvastatin (LIPITOR) 10 MG tablet Take 10 mg by mouth daily.    . calcium carbonate (OS-CAL - DOSED IN MG OF ELEMENTAL CALCIUM) 1250 (500 Ca) MG tablet Take 1 tablet by mouth.    . ergocalciferol (VITAMIN D2) 1.25 MG (50000 UT) capsule Take 50,000 Units by mouth once a week.    . Multiple Vitamin (MULTIVITAMIN WITH MINERALS)  TABS tablet Take 1 tablet by mouth daily.    Marland Kitchen testosterone cypionate (DEPOTESTOSTERONE CYPIONATE) 200 MG/ML injection Inject into the muscle every 30 (thirty) days.     No current facility-administered medications for this visit.    Review of Systems  Constitutional: Negative for chills, diaphoresis, fever, malaise/fatigue and weight loss.       Feels "good". Good energy level.  HENT: Negative.  Negative for congestion, ear pain, hearing loss, nosebleeds, sinus pain, sore throat and tinnitus.   Eyes: Negative.  Negative for blurred vision, double vision and photophobia.  Respiratory: Positive for cough (allergies). Negative for hemoptysis, sputum production and shortness of breath.   Cardiovascular: Negative.  Negative for chest pain, palpitations and leg swelling.  Gastrointestinal: Negative for abdominal pain, blood in stool, constipation, diarrhea, heartburn, melena, nausea and vomiting.       Stool is narrow and softer. Bowel movement daily. Occasional mucus in stool.  Genitourinary: Negative for dysuria, frequency, hematuria and urgency.       Perineal weakness.  Some urinary leakage.  Musculoskeletal: Negative for back pain, joint pain, myalgias and neck pain.  Skin: Negative for itching and rash (in rectal area depends on the food she eats).       Last staph infection at site of colostomy reversal in 05/2019.  Neurological: Negative for dizziness, tingling, sensory change, speech change, focal weakness, weakness and headaches.       "Chemo brain".  Endo/Heme/Allergies: Positive for environmental allergies. Does not bruise/bleed easily.  Psychiatric/Behavioral: Negative.  Negative for depression and memory loss. The patient is not nervous/anxious and does not have insomnia.   All other systems reviewed and are negative.  Performance status (ECOG): 1  Physical Exam Nursing note reviewed.  Constitutional:      General: She is not in acute distress.    Appearance: She is  well-developed and well-nourished. She is not diaphoretic.  HENT:      Comments: Long gray hair pulled back. Eyes:     General: No scleral icterus.    Extraocular Movements: EOM normal.     Conjunctiva/sclera: Conjunctivae normal.     Comments: Glasses.  Blue eyes.  Neurological:     Mental Status: She is alert and oriented to person, place, and time.  Psychiatric:        Mood and Affect: Mood and affect normal.        Behavior: Behavior normal.        Thought Content: Thought content normal.        Judgment: Judgment normal.    Imaging studies: 02/15/2018:  Ultrasound revealed a 2.7 x 3.3 x 3.2 cm complex perineal mass.  03/25/2018:  MRI of the pelvis revealed a 3.2 cm palpable perineal mass invading the distal one third of the posterior vaginal wall extending to the introitus, the adjacent vaginal rectal perineum and the anterior anus extending in close proximity to the rectal anal junction craniocaudally to the introitus and appeared to extend from the 9:00 to 3 o'clock position.  Differential included anal carcinoma or vaginal carcinoma origin.  There  was no adenopathy or metastatic disease. 05/17/2018:  PET scan revealed focal anal rectal uptake consistent with her history of malignancy.  There was no hypermetabolic lymph nodes in the abdomen or pelvis.  There was no evidence of metastatic disease. 12/24/2018:  PET scan revealed no evidence of disease.   01/09/2019:  Fluoroscopy barium enema revealed no rectovaginal fistula.  There was diverticulosis involving the residual sigmoid colon.   03/18/2019:  Chest, abdomen and pelvis CT revealed no evidence of disease recurrence. ss or tenderness of her hands and feet. She had lost some hair and  09/23/2019:  PET scan revealed no evidence of recurrent or metastatic disease.  There were hypermetabolic right/supraclavicular lymph nodes c/w COVID-19 vaccination 09/20/2019.  There was mild nonspecific hypermetabolism (SUV 3.2) within a  6 mm  normal-sized subcarinal lymph node.   No visits with results within 3 Day(s) from this visit.  Latest known visit with results is:  Hospital Outpatient Visit on 09/23/2019  Component Date Value Ref Range Status  . Glucose-Capillary 09/23/2019 99  70 - 99 mg/dL Final   Glucose reference range applies only to samples taken after fasting for at least 8 hours.    Assessment:  Courtney Gallegos is a 64 y.o. female with stage III anal squamous cell carcinoma s/p biopsy on 04/09/2018.  Pathology revealed a moderately differentiated squamous cell carcinoma, P16+.   MRI of the pelvis on 03/25/2018 revealed a 3.2 cm palpable perineal mass invading the distal one third of the posterior vaginal wall extending to the introitus, the adjacent vaginal rectal perineum and the anterior anus extending in close proximity to the rectal anal junction craniocaudally to the introitus and appeared to extend from the 9:00 to 3 o'clock position.   PET scan on 05/17/2018 revealed focal anal rectal uptake consistent with her history of malignancy.  There was no hypermetabolic lymph nodes in the abdomen or pelvis.  There was no evidence of metastatic disease.  She underwent laparoscopic diverting colostomy on 06/05/2018.  She received 2 cycles of capecitabine + mitomycin (07/02/2018 - 07/30/2018) with concurrent radiation  (07/02/2018 - 08/10/2018).  She underwent colostomy reversal on 02/11/2019.  She enrolled on clinical trial [id number] available through the [facility]:  A randomized phase 2 study of nivolumab after combined modality therapy in high risk anal cancer.  She received 4 cycles of nivolumab (09/19/2018 - 12/12/2018).    Chest, abdomen and pelvis CT on 03/18/2019 revealed no evidence of disease recurrence.  PET scan on 09/23/2019 revealed no evidence of recurrent or metastatic disease.  There were hypermetabolic right/supraclavicular lymph nodes c/w COVID-19 vaccination on 09/20/2019.  There was  mild nonspecific hypermetabolism (SUV 3.2) within a  6 mm subcarinal lymph node.  Patient received her last COVID-19 vaccine on 09/20/2019.  Symptomatically, she feels "good".  Plan: 1.   Stage III anal squamous cell carcinoma  She is s/p diverting colostomy and 2 cycles of capecitabine + mitomycin with concurrent radiation.  She is s/p colostomy reversal.  She enrolled on clinical trial [id number] and is s/p 4 cycles of nivolumab.  PET scan on 09/23/2019 was personally reviewed.  Agree with radiology interpretation.   There was no evidence of recurrent or metastatic disease.     There were hypermetabolic right/supraclavicular lymph nodes c/w COVID-19 vaccination.    There was mild nonspecific hypermetabolism within a normal-sized subcarinal lymph node.    Copy of PET scan to patient.  Clinical trials nurse Roger Shelter for clinical trial follow-up requirements.  She requires follow-up endoscopy.     She initially requested follow-up with Dr Cinda Quest [phone]) recommended by her surgeon in [place].   Patient wishes to have endoscopy performed locally.  Referral to GI (Dr Allen Norris) for anoscopy/proctoscopy, 2.   Cognitive function s/p chemotherapy  Patient notes "chemo brain".  Consider enrollment on the Remember study. 3.   RN to talk to Dr Venora Maples [phone] (#[phone]; triage ext [phone]) re: follow-up studies (sending information for clinical trial). 4.   RTC in 3 months for MD assess, labs (CBC with diff, CMP), and review of endoscopy.  I discussed the assessment and treatment plan with the patient.  The patient was provided an opportunity to ask questions and all were answered.  The patient agreed with the plan and demonstrated an understanding of the instructions.  The patient was advised to call back if the symptoms worsen or if the condition fails to improve as anticipated.  I provided 16 minutes (9:32 AM - 9:48 AM) of face-to-face time during this encounter.  An additional 5-6  minutes were spent reviewing her chart (Epic and Care Everywhere) including notes, labs, and imaging studies. [facility].   Lyncoln Maskell C. Mike Gip, MD, PhD    09/26/2019, 9:32 AM  I, Selena Batten, am acting as a scribe for Lequita Asal, MD.  I, Dunreith Mike Gip, MD, have reviewed the above documentation for accuracy and completeness, and I agree with the above.

## 2019-09-25 ENCOUNTER — Inpatient Hospital Stay: Payer: Medicare Other | Admitting: Hematology and Oncology

## 2019-09-25 ENCOUNTER — Encounter: Payer: Self-pay | Admitting: Hematology and Oncology

## 2019-09-25 NOTE — Progress Notes (Signed)
No new changes noted today. The patient name and DOB has been verified by phone today. 

## 2019-09-26 ENCOUNTER — Telehealth: Payer: Self-pay

## 2019-09-26 ENCOUNTER — Inpatient Hospital Stay: Payer: Medicare Other | Attending: Hematology and Oncology | Admitting: Hematology and Oncology

## 2019-09-26 ENCOUNTER — Encounter: Payer: Self-pay | Admitting: Hematology and Oncology

## 2019-09-26 DIAGNOSIS — C211 Malignant neoplasm of anal canal: Secondary | ICD-10-CM

## 2019-09-26 NOTE — Telephone Encounter (Signed)
VM left with Dr. Venora Maples team requesting information regarding clinical trial that patient is currently participating in. Patient has informed us that she was told facility is unable to accept any of our results for the clinical trial and that she would need to travel back to out of state facility to keep participating. Attempted to find out if we can fax over results so that she may continue to participate in program and if not, patient has informed Dr. Mike Gip she does not wish to continue in clinical trial because she is unable to travel to facility. Callback number provided.   Contact information for Dr. Venora Maples clinical team: [phone], Press 1 to enter extension, Ext. [phone]

## 2019-10-10 ENCOUNTER — Telehealth: Payer: Self-pay

## 2019-10-10 NOTE — Telephone Encounter (Signed)
Spoke with receptionist with Dr. Venora Maples' office. Informed him this is second attempt reaching out to Dr. Venora Maples' nurse. Receptionist states a note will be sent to nurse to call back.   This is regarding clinical trial that patient is currently participating in. Patient has informed us that she was told facility is unable to accept any of our results for the clinical trial and that she would need to travel back to out of state facility to keep participating. Can we fax over results so that she may continue to participate in program and if not, patient has informed Dr. Mike Gip she does not wish to continue in clinical trial because she is unable to travel to facility.   Contact information for Dr. Venora Maples clinical team: [phone], Press 1 to enter extension, Ext. [phone]

## 2019-10-25 ENCOUNTER — Other Ambulatory Visit (HOSPITAL_COMMUNITY): Payer: Self-pay | Admitting: Internal Medicine

## 2019-10-25 DIAGNOSIS — Z1231 Encounter for screening mammogram for malignant neoplasm of breast: Secondary | ICD-10-CM

## 2019-11-04 ENCOUNTER — Telehealth: Payer: Self-pay | Admitting: Adult Health

## 2019-11-04 NOTE — Telephone Encounter (Signed)

## 2019-11-05 ENCOUNTER — Other Ambulatory Visit (HOSPITAL_COMMUNITY)
Admission: RE | Admit: 2019-11-05 | Discharge: 2019-11-05 | Disposition: A | Discharge status: Home / Self Care | Payer: Medicare Other | Source: Ambulatory Visit | Attending: Adult Health | Admitting: Adult Health

## 2019-11-05 ENCOUNTER — Ambulatory Visit (INDEPENDENT_AMBULATORY_CARE_PROVIDER_SITE_OTHER): Payer: Medicare Other | Admitting: Adult Health

## 2019-11-05 ENCOUNTER — Encounter: Payer: Self-pay | Admitting: Adult Health

## 2019-11-05 VITALS — BP 106/70 | HR 73 | Ht 67.5 in | Wt 152.5 lb

## 2019-11-05 DIAGNOSIS — Z01419 Encounter for gynecological examination (general) (routine) without abnormal findings: Secondary | ICD-10-CM | POA: Diagnosis present

## 2019-11-05 DIAGNOSIS — N952 Postmenopausal atrophic vaginitis: Secondary | ICD-10-CM | POA: Diagnosis not present

## 2019-11-05 DIAGNOSIS — Z1212 Encounter for screening for malignant neoplasm of rectum: Secondary | ICD-10-CM | POA: Diagnosis not present

## 2019-11-05 DIAGNOSIS — Z1272 Encounter for screening for malignant neoplasm of vagina: Secondary | ICD-10-CM | POA: Diagnosis not present

## 2019-11-05 DIAGNOSIS — Z1211 Encounter for screening for malignant neoplasm of colon: Secondary | ICD-10-CM | POA: Insufficient documentation

## 2019-11-05 DIAGNOSIS — Z85048 Personal history of other malignant neoplasm of rectum, rectosigmoid junction, and anus: Secondary | ICD-10-CM | POA: Diagnosis not present

## 2019-11-05 DIAGNOSIS — Z01411 Encounter for gynecological examination (general) (routine) with abnormal findings: Secondary | ICD-10-CM

## 2019-11-05 DIAGNOSIS — Z1151 Encounter for screening for human papillomavirus (HPV): Secondary | ICD-10-CM | POA: Insufficient documentation

## 2019-11-05 DIAGNOSIS — Z124 Encounter for screening for malignant neoplasm of cervix: Secondary | ICD-10-CM

## 2019-11-05 LAB — HEMOCCULT GUIAC POC 1CARD (OFFICE): Fecal Occult Blood, POC: NEGATIVE

## 2019-11-05 MED ORDER — PREMARIN 0.625 MG/GM VA CREA
TOPICAL_CREAM | VAGINAL | 1 refills | Status: DC
Start: 2019-11-05 — End: 2019-12-03

## 2019-11-05 NOTE — Progress Notes (Signed)
Patient ID: Courtney Gallegos, female   DOB: 03/19/1956, 64 y.o.   MRN: [id number] History of Present Illness:  Courtney Gallegos is a 64 year old white female, with significant other, PM in for pelvic and pap,she is a new pt, referred by Dr Willey Blade. She had anal cancer las year and had chemo and radiation.  She moved here from the [facility].  PCP is Dr Willey Blade.   Current Medications, Allergies, Past Medical History, Past Surgical History, Family History and Social History were reviewed in [employer] record.     Review of Systems: Has discomfort with sex Has hot flashes and night sweats some now, has used testosterone injection in the past  Will lose stool at times, sp radiation for anal cancer Reviewed past medical,surgical, social and family history. Reviewed medications and allergies.     Physical Exam:BP 106/70 (BP Location: Left Arm, Patient Position: Sitting, Cuff Size: Normal)   Pulse 73   Ht 5' 7.5" (1.715 m)   Wt 152 lb 8 oz (69.2 kg)   BMI 23.53 kg/m  General:  Well developed, well nourished, no acute distress Skin:  Warm and dry Lungs; Clear to auscultation bilaterally Cardiovascular: Regular rate and rhythm Pelvic:  External genitalia is normal in appearance, no lesions.  The vagina is atrophic, pale and dry, with loss of rugae. Urethra has no lesions or masses. The cervix is smooth, pap with high risk HPV 16/18 genotyping perofmed.  Uterus is felt to be normal size, shape, and contour.  No adnexal masses or tenderness noted.Bladder is non tender, no masses felt. Rectal: Good sphincter tone, no polyps, or hemorrhoids felt.  Hemoccult negative. Psych:  No mood changes, alert and cooperative,seems happy AA is 5 Fall risk is low PHQ 9 score is 0.  Impression and Plan: 1. Routine cervical smear Pap sent  2. Encounter for gynecological examination with Papanicolaou smear of cervix Pap sent Physical with PCP Pap in 3 years if normal   3. Screening for colorectal  cancer Colonoscopy in near future  4. Vaginal atrophy Will try PVC Meds ordered this encounter  Medications  . conjugated estrogens (PREMARIN) vaginal cream    Sig: Use 0.5 gm in vagina daily for 2 weeks, then 2 x weekly    Dispense:  42.5 g    Refill:  1    Order Specific Question:   Supervising Provider    Answer:   EURE, LUTHER H [[id number]]    5. History of anal cancer

## 2019-11-07 ENCOUNTER — Ambulatory Visit (HOSPITAL_COMMUNITY)
Admission: RE | Admit: 2019-11-07 | Discharge: 2019-11-07 | Disposition: A | Discharge status: Home / Self Care | Payer: Medicare Other | Source: Ambulatory Visit | Attending: Internal Medicine | Admitting: Internal Medicine

## 2019-11-07 ENCOUNTER — Ambulatory Visit (INDEPENDENT_AMBULATORY_CARE_PROVIDER_SITE_OTHER): Payer: Medicare Other | Admitting: Gastroenterology

## 2019-11-07 ENCOUNTER — Other Ambulatory Visit: Payer: Self-pay

## 2019-11-07 ENCOUNTER — Telehealth: Payer: Self-pay | Admitting: Adult Health

## 2019-11-07 ENCOUNTER — Encounter: Payer: Self-pay | Admitting: Gastroenterology

## 2019-11-07 VITALS — BP 113/67 | HR 83 | Temp 98.1°F | Ht 67.5 in | Wt 152.0 lb

## 2019-11-07 DIAGNOSIS — Z1231 Encounter for screening mammogram for malignant neoplasm of breast: Secondary | ICD-10-CM | POA: Diagnosis present

## 2019-11-07 DIAGNOSIS — C211 Malignant neoplasm of anal canal: Secondary | ICD-10-CM | POA: Diagnosis not present

## 2019-11-07 LAB — CYTOLOGY - PAP
Comment: NEGATIVE
Diagnosis: NEGATIVE
High risk HPV: NEGATIVE

## 2019-11-07 NOTE — Telephone Encounter (Signed)
Left message that pap was negative for malignancy and HPV 

## 2019-11-07 NOTE — Progress Notes (Signed)
Gastroenterology Consultation  Referring Provider:     Lequita Asal, MD Primary Care Physician:  Asencion Noble, MD Primary Gastroenterologist:  Dr. Allen Norris     Reason for Consultation:     History of anal cancer        HPI:   Courtney Gallegos is a 64 y.o. y/o female referred for consultation & management of history of anal cancer by Dr. Asencion Noble, MD.  This patient comes in today after being sent by oncology for history of anal cancer.  The patient was treated with chemotherapy and radiation on the [facility] and then moved here.  The patient's treatment was approximately a year ago.  The patient had a PET scan on March 29 of this year that showed:  IMPRESSION: 1. No evidence of recurrent or metastatic disease. 2. Hypermetabolic right/supraclavicular lymph nodes, consistent with COVID-19 vaccination 09/20/2019. 3. Mild nonspecific hypermetabolism within a normal-sized subcarinal lymph node. 4.  Aortic atherosclerosis  It was recommended that the patient have a colonoscopy due to her history of anal cancer.  The patient denies any rectal bleeding change in bowel habits nausea vomiting fevers or chills.  The patient does report that she had a colonoscopy right before the treatment for her anal cancer and also reports that she believes she had one the year prior to that. The patient is very pleasant but questioning if she really needs this because she had a PET scan that was negative.  She feels that she has been to see only doctors and cannot do so much.  She denies any family history of colon cancer or colon polyps.  Past Medical History:  Diagnosis Date  . Anal cancer ([place]) 06/07/2019  . Hyperlipidemia 06/07/2019    Past Surgical History:  Procedure Laterality Date  . OSTOMY Left 06/07/2019    Prior to Admission medications   Medication Sig Start Date End Date Taking? Authorizing Provider  BIOTIN PO Take by mouth.    [provider]  calcium carbonate (OS-CAL - DOSED  IN MG OF ELEMENTAL CALCIUM) 1250 (500 Ca) MG tablet Take 1 tablet by mouth.    [provider]  conjugated estrogens (PREMARIN) vaginal cream Use 0.5 gm in vagina daily for 2 weeks, then 2 x weekly 11/05/19   Derrek Monaco A, NP  ergocalciferol (VITAMIN D2) 1.25 MG (50000 UT) capsule Take 50,000 Units by mouth once a week.    [provider]  Multiple Vitamin (MULTIVITAMIN WITH MINERALS) TABS tablet Take 1 tablet by mouth daily.    [provider]  rosuvastatin (CRESTOR) 5 MG tablet Take 5 mg by mouth daily. 10/09/19   [provider]    Family History  Problem Relation Age of Onset  . ALS Mother   . Cancer Father      Social History   Tobacco Use  . Smoking status: Never Smoker  . Smokeless tobacco: Never Used  Substance Use Topics  . Alcohol use: Yes    Alcohol/week: 2.0 - 4.0 standard drinks    Types: 1 - 2 Glasses of wine, 1 - 2 Shots of liquor per week  . Drug use: Never    Allergies as of 11/07/2019  . (No Known Allergies)    Review of Systems:    All systems reviewed and negative except where noted in HPI.   Physical Exam:  There were no vitals taken for this visit. No LMP recorded. Patient has had an ablation. General:   Alert,  Well-developed, well-nourished,  pleasant and cooperative in NAD Head:  Normocephalic and atraumatic. Eyes:  Sclera clear, no icterus.   Conjunctiva pink. Ears:  Normal auditory acuity. Neck:  Supple; no masses or thyromegaly. Lungs:  Respirations even and unlabored.  Clear throughout to auscultation.   No wheezes, crackles, or rhonchi. No acute distress. Heart:  Regular rate and rhythm; no murmurs, clicks, rubs, or gallops. Abdomen:  Normal bowel sounds.  No bruits.  Soft, non-tender and non-distended without masses, hepatosplenomegaly or hernias noted.  No guarding or rebound tenderness.  Negative Carnett sign.   Rectal:  Deferred.  Pulses:  Normal pulses noted. Extremities:  No clubbing or edema.  No  cyanosis. Neurologic:  Alert and oriented x3;  grossly normal neurologically. Skin:  Intact without significant lesions or rashes.  No jaundice. Lymph Nodes:  No significant cervical adenopathy. Psych:  Alert and cooperative. Normal mood and affect.  Imaging Studies: No results found.  Assessment and Plan:   Courtney Gallegos is a 64 y.o. y/o female who comes in today with a history of anal cancer and treatment a year ago with chemotherapy radiation and she also says she was treated with immunotherapy.  She states that she has been given a clean bill of health and that her PET scan was negative.  Therefore she states that she does not want to undergo a repeat colonoscopy  the patient has been told about the recommendations and that she should be followed closely due to her history of cancer.  The patient states that she will see what the next PET scan shows and will think about repeating the colonoscopy in September.  The patient has been told about the risks and benefits of the procedure.  The patient has been explained the plan agrees with it.    [name], MD. Marval Regal    Note: This dictation was prepared with Dragon dictation along with smaller phrase technology. Any transcriptional errors that result from this process are unintentional.

## 2019-11-19 ENCOUNTER — Other Ambulatory Visit (HOSPITAL_COMMUNITY): Payer: Self-pay | Admitting: Internal Medicine

## 2019-11-19 ENCOUNTER — Inpatient Hospital Stay
Admission: RE | Admit: 2019-11-19 | Discharge: 2019-11-19 | Disposition: A | Discharge status: Home / Self Care | Payer: Self-pay | Source: Ambulatory Visit | Attending: Internal Medicine | Admitting: Internal Medicine

## 2019-11-19 DIAGNOSIS — Z1231 Encounter for screening mammogram for malignant neoplasm of breast: Secondary | ICD-10-CM

## 2019-12-03 ENCOUNTER — Other Ambulatory Visit: Payer: Self-pay | Admitting: Adult Health

## 2019-12-10 ENCOUNTER — Other Ambulatory Visit: Payer: Self-pay | Admitting: Adult Health

## 2019-12-10 MED ORDER — ESTRADIOL 0.1 MG/GM VA CREA: TOPICAL_CREAM | VAGINAL | 1 refills | Status: DC

## 2019-12-10 NOTE — Progress Notes (Signed)
Estradiol cream to [facility]

## 2019-12-13 ENCOUNTER — Other Ambulatory Visit: Payer: Self-pay | Admitting: Adult Health

## 2019-12-13 MED ORDER — ESTRADIOL 0.1 MG/GM VA CREA: TOPICAL_CREAM | VAGINAL | 1 refills | Status: DC

## 2019-12-13 NOTE — Progress Notes (Signed)
Refill estrace at [facility]

## 2019-12-25 NOTE — Progress Notes (Signed)
[facility]  [address], [address] [place], [place] [address] Phone: [phone]  Fax: [phone]   Clinic Day:  12/26/2019  Referring physician: No ref. provider found  Chief Complaint: Courtney Gallegos is a 64 y.o. female with stage III anal squamous cell carcinoma who is seen for a 3 month assessment.  HPI: The patient was last seen in the medical oncology clinic on 09/26/2019 via telemedicine. At that time, she felt "good."  The patient was seen by Dr. Allen Norris on 11/07/2019. She did not want to undergo a repeat colonoscopy because her PET scan was negative. She will see what the next PET scan shows and will reconsider in 02/2020.  Mammogram on 11/07/2019 revealed no evidence of malignancy.  During the interim, she has been "fine." She is planning to get a colonoscopy in 02/2020. She has taken herself off of the clinical trial because they wanted her to drive to [place].   The patient has received both doses of the COVID-19 vaccine.   Past Medical History:  Diagnosis Date  . Anal cancer ([place]) 06/07/2019  . Hyperlipidemia 06/07/2019    Past Surgical History:  Procedure Laterality Date  . OSTOMY Left 06/07/2019    Family History  Problem Relation Age of Onset  . ALS Mother   . Cancer Father     Social History:  reports that she has never smoked. She has never used smokeless tobacco. She reports current alcohol use of about 2.0 - 4.0 standard drinks of alcohol per week. She reports that she does not use drugs. She is currently retired.  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 She was an ex-smoker, who quit long time ago when she was a teenager. She denies any exposure to radiation outside of treatment. Sh has no exposure to toxins. She drinks a couple times a week.  Patient used to live in [place], [place]. She lives in [place], [place]. Her daughter and grandchildren liver in [place], [place].  The patient is alone today.   Allergies: No  Known Allergies  Current Medications: Current Outpatient Medications  Medication Sig Dispense Refill  . BIOTIN PO Take by mouth.    . calcium carbonate (OS-CAL - DOSED IN MG OF ELEMENTAL CALCIUM) 1250 (500 Ca) MG tablet Take 1 tablet by mouth.    . ergocalciferol (VITAMIN D2) 1.25 MG (50000 UT) capsule Take 50,000 Units by mouth once a week.    . Multiple Vitamin (MULTIVITAMIN WITH MINERALS) TABS tablet Take 1 tablet by mouth daily.    . rosuvastatin (CRESTOR) 5 MG tablet Take 5 mg by mouth daily.    Marland Kitchen estradiol (ESTRACE) 0.1 MG/GM vaginal cream Use .05 gm in vagina at bedtime for 2 weeks then 2-3 x weekly 42.5 g 1   No current facility-administered medications for this visit.    Review of Systems  Constitutional: Negative for chills, diaphoresis, fever, malaise/fatigue and weight loss.       Feels "fine."  HENT: Negative.  Negative for congestion, ear pain, hearing loss, nosebleeds, sinus pain, sore throat and tinnitus.   Eyes: Negative.  Negative for blurred vision, double vision and photophobia.  Respiratory: Negative for cough, hemoptysis, sputum production and shortness of breath.   Cardiovascular: Negative.  Negative for chest pain, palpitations and leg swelling.  Gastrointestinal: Negative for abdominal pain, blood in stool, constipation, diarrhea, heartburn, melena, nausea and vomiting.  Genitourinary: Negative for dysuria, frequency, hematuria and urgency.  Musculoskeletal: Negative for back pain, joint pain, myalgias  and neck pain.  Skin: Negative for itching and rash.       Last staph infection at site of colostomy reversal in 05/2019.  Neurological: Negative for dizziness, tingling, sensory change, speech change, focal weakness, weakness and headaches.  Endo/Heme/Allergies: Negative for environmental allergies. Does not bruise/bleed easily.  Psychiatric/Behavioral: Negative.  Negative for depression and memory loss. The patient is not nervous/anxious and does not have  insomnia.   All other systems reviewed and are negative.  Performance status (ECOG): 0  Blood pressure 100/67, pulse 71, temperature (!) 97.3 F (36.3 C), resp. rate 20, weight 154 lb 0.9 oz (69.9 kg), SpO2 100 %.   Physical Exam Vitals and nursing note reviewed.  Constitutional:      General: She is not in acute distress.    Appearance: She is well-developed. She is not diaphoretic.  HENT:     Head: Normocephalic and atraumatic.  Eyes:     General: No scleral icterus.    Extraocular Movements: Extraocular movements intact.     Conjunctiva/sclera: Conjunctivae normal.     Pupils: Pupils are equal, round, and reactive to light.     Comments: Glasses.  Blue eyes.  Cardiovascular:     Rate and Rhythm: Normal rate and regular rhythm.     Heart sounds: Normal heart sounds. No murmur heard.   Pulmonary:     Effort: Pulmonary effort is normal. No respiratory distress.     Breath sounds: Normal breath sounds. No wheezing or rales.  Chest:     Chest wall: No tenderness.  Abdominal:     General: Bowel sounds are normal. There is no distension.     Palpations: Abdomen is soft. There is no mass.     Tenderness: There is no abdominal tenderness. There is no guarding or rebound.  Genitourinary:    Comments: Ridged scar at 6 o'clock position on rectal exam. No masses. Musculoskeletal:        General: No swelling or tenderness. Normal range of motion.     Cervical back: Normal range of motion and neck supple.  Lymphadenopathy:     Head:     Right side of head: No preauricular, posterior auricular or occipital adenopathy.     Left side of head: No preauricular, posterior auricular or occipital adenopathy.     Cervical: No cervical adenopathy.     Upper Body:     Right upper body: No supraclavicular or axillary adenopathy.     Left upper body: No supraclavicular or axillary adenopathy.     Lower Body: No right inguinal adenopathy. No left inguinal adenopathy.  Skin:    General: Skin is  warm and dry.  Neurological:     Mental Status: She is alert and oriented to person, place, and time. Mental status is at baseline.  Psychiatric:        Mood and Affect: Mood normal.        Behavior: Behavior normal.        Thought Content: Thought content normal.        Judgment: Judgment normal.    Imaging studies: 02/15/2018:  Ultrasound revealed a 2.7 x 3.3 x 3.2 cm complex perineal mass.  03/25/2018:  MRI of the pelvis revealed a 3.2 cm palpable perineal mass invading the distal one third of the posterior vaginal wall extending to the introitus, the adjacent vaginal rectal perineum and the anterior anus extending in close proximity to the rectal anal junction craniocaudally to the introitus and appeared to extend from  the 9:00 to 3 o'clock position.  Differential included anal carcinoma or vaginal carcinoma origin.  There was no adenopathy or metastatic disease. 05/17/2018:  PET scan revealed focal anal rectal uptake consistent with her history of malignancy.  There was no hypermetabolic lymph nodes in the abdomen or pelvis.  There was no evidence of metastatic disease. 12/24/2018:  PET scan revealed no evidence of disease.   01/09/2019:  Fluoroscopy barium enema revealed no rectovaginal fistula.  There was diverticulosis involving the residual sigmoid colon.   03/18/2019:  Chest, abdomen and pelvis CT revealed no evidence of disease recurrence. ss or tenderness of her hands and feet. She had lost some hair and  09/23/2019:  PET scan revealed no evidence of recurrent or metastatic disease.  There were hypermetabolic right/supraclavicular lymph nodes c/w COVID-19 vaccination 09/20/2019.  There was mild nonspecific hypermetabolism (SUV 3.2) within a  6 mm normal-sized subcarinal lymph node.   Appointment on 12/26/2019  Component Date Value Ref Range Status  . Sodium 12/26/2019 139  135 - 145 mmol/L Final  . Potassium 12/26/2019 3.7  3.5 - 5.1 mmol/L Final  . Chloride 12/26/2019 103  98 -  111 mmol/L Final  . CO2 12/26/2019 28  22 - 32 mmol/L Final  . Glucose, Bld 12/26/2019 131* 70 - 99 mg/dL Final   Glucose reference range applies only to samples taken after fasting for at least 8 hours.  . BUN 12/26/2019 20  8 - 23 mg/dL Final  . Creatinine, Ser 12/26/2019 0.72  0.44 - 1.00 mg/dL Final  . Calcium 12/26/2019 9.1  8.9 - 10.3 mg/dL Final  . Total Protein 12/26/2019 7.4  6.5 - 8.1 g/dL Final  . Albumin 12/26/2019 4.5  3.5 - 5.0 g/dL Final  . AST 12/26/2019 21  15 - 41 U/L Final  . ALT 12/26/2019 23  0 - 44 U/L Final  . Alkaline Phosphatase 12/26/2019 53  38 - 126 U/L Final  . Total Bilirubin 12/26/2019 0.5  0.3 - 1.2 mg/dL Final  . GFR calc non Af Amer 12/26/2019 >60  >60 mL/min Final  . GFR calc Af Amer 12/26/2019 >60  >60 mL/min Final  . Anion gap 12/26/2019 8  5 - 15 Final   Performed at [facility], [address], [place], [place] [address]  . WBC 12/26/2019 6.0  4.0 - 10.5 K/uL Final  . RBC 12/26/2019 4.18  3.87 - 5.11 MIL/uL Final  . Hemoglobin 12/26/2019 13.8  12.0 - 15.0 g/dL Final  . HCT 12/26/2019 39.2  36 - 46 % Final  . MCV 12/26/2019 93.8  80.0 - 100.0 fL Final  . MCH 12/26/2019 33.0  26.0 - 34.0 pg Final  . MCHC 12/26/2019 35.2  30.0 - 36.0 g/dL Final  . RDW 12/26/2019 12.3  11.5 - 15.5 % Final  . Platelets 12/26/2019 231  150 - 400 K/uL Final  . nRBC 12/26/2019 0.0  0.0 - 0.2 % Final  . Neutrophils Relative % 12/26/2019 58  % Final  . Neutro Abs 12/26/2019 3.6  1.7 - 7.7 K/uL Final  . Lymphocytes Relative 12/26/2019 28  % Final  . Lymphs Abs 12/26/2019 1.7  0.7 - 4.0 K/uL Final  . Monocytes Relative 12/26/2019 10  % Final  . Monocytes Absolute 12/26/2019 0.6  0 - 1 K/uL Final  . Eosinophils Relative 12/26/2019 3  % Final  . Eosinophils Absolute 12/26/2019 0.2  0 - 0 K/uL Final  . Basophils Relative 12/26/2019 1  % Final  .  Basophils Absolute 12/26/2019 0.0  0 - 0 K/uL Final  . Immature Granulocytes 12/26/2019 0  % Final  . Abs Immature  Granulocytes 12/26/2019 0.02  0.00 - 0.07 K/uL Final   Performed at [facility], [address] [place], [place] [address]    Assessment:  Courtney Gallegos is a 64 y.o. female with stage III anal squamous cell carcinoma s/p biopsy on 04/09/2018.  Pathology revealed a moderately differentiated squamous cell carcinoma, P16+.   MRI of the pelvis on 03/25/2018 revealed a 3.2 cm palpable perineal mass invading the distal one third of the posterior vaginal wall extending to the introitus, the adjacent vaginal rectal perineum and the anterior anus extending in close proximity to the rectal anal junction craniocaudally to the introitus and appeared to extend from the 9:00 to 3 o'clock position.   PET scan on 05/17/2018 revealed focal anal rectal uptake consistent with her history of malignancy.  There was no hypermetabolic lymph nodes in the abdomen or pelvis.  There was no evidence of metastatic disease.  She underwent laparoscopic diverting colostomy on 06/05/2018.  She received 2 cycles of capecitabine + mitomycin (07/02/2018 - 07/30/2018) with concurrent radiation  (07/02/2018 - 08/10/2018).  She underwent colostomy reversal on 02/11/2019.  She enrolled on clinical trial [id number] available through the [facility] A:  A randomized phase 2 study of nivolumab after combined modality therapy in high risk anal cancer.  She received 4 cycles of nivolumab (09/19/2018 - 12/12/2018).    Chest, abdomen and pelvis CT on 03/18/2019 revealed no evidence of disease recurrence.  PET scan on 09/23/2019 revealed no evidence of recurrent or metastatic disease.  There were hypermetabolic right/supraclavicular lymph nodes c/w COVID-19 vaccination on 09/20/2019.  There was mild nonspecific hypermetabolism (SUV 3.2) within a  6 mm subcarinal lymph node.  Patient received her last COVID-19 vaccine on 09/20/2019.    Symptomatically, she denies any abdominal symptoms.  Rectal exam reveals ridging at  the 6 o'clock position.  Plan: 1.   Labs today: CBC with diff, CMP 2.   Stage III anal squamous cell carcinoma  She is s/p diverting colostomy and 2 cycles of capecitabine + mitomycin with concurrent radiation.  She is s/p colostomy reversal.  She enrolled on clinical trial [id number] and is s/p 4 cycles of nivolumab.   She has withdrawn from the trial secondary to follow-up needed in [place].  PET scan on 09/23/2019 revealed no evidence of recurrent or metastatic disease.     There were hypermetabolic right/supraclavicular lymph nodes c/w COVID-19 vaccination.    There was mild nonspecific hypermetabolism within a normal-sized subcarinal lymph node.   She requires follow-up endoscopy.     She initially requested follow-up with Dr Cinda Quest [phone]) recommended by her surgeon in [place].   Review GI consult with Dr Allen Norris.   Discuss NCCN guidelines:   DRE every 3-6 months x 5 years.  Anoscopy 6-12 months x 3 years.  Chest, abdomen and pelvis CT annually x 3 years.  Discuss importance of anoscopy.  Discuss follow-up CT scan vs PET scan.     Discuss follow-up of hypermetabolic supraclavicular node.   Discuss follow-up appointment with Dr Allen Norris for anoscopy. 3.   PET scan 03/17/2020. 4.   Please schedule f/u appt with Dr Allen Norris. 5.   RTC after PET scan for MD assess and review of imaging.  I discussed the assessment and treatment plan with the patient.  The patient was provided an opportunity to ask questions  and all were answered.  The patient agreed with the plan and demonstrated an understanding of the instructions.  The patient was advised to call back if the symptoms worsen or if the condition fails to improve as anticipated.  I provided 24 minutes of face-to-face time during this this encounter and > 50% was spent counseling as documented under my assessment and plan.  An additional 5 minutes were spent reviewing her chart (Epic and Care Everywhere) including notes, labs, and imaging  studies.    Eaton Folmar C. Mike Gip, MD, PhD    12/26/2019, 4:02 PM  I, Mirian Mo Tufford, am acting as a Education administrator for Lequita Asal, MD.  I, [place] Mike Gip, MD, have reviewed the above documentation for accuracy and completeness, and I agree with the above.

## 2019-12-26 ENCOUNTER — Encounter: Payer: Self-pay | Admitting: Hematology and Oncology

## 2019-12-26 ENCOUNTER — Inpatient Hospital Stay: Payer: Medicare Other | Attending: Hematology and Oncology | Admitting: Hematology and Oncology

## 2019-12-26 ENCOUNTER — Inpatient Hospital Stay: Payer: Medicare Other

## 2019-12-26 ENCOUNTER — Other Ambulatory Visit: Payer: Self-pay

## 2019-12-26 VITALS — BP 100/67 | HR 71 | Temp 97.3°F | Resp 20 | Wt 154.1 lb

## 2019-12-26 DIAGNOSIS — Z923 Personal history of irradiation: Secondary | ICD-10-CM | POA: Diagnosis not present

## 2019-12-26 DIAGNOSIS — Z85048 Personal history of other malignant neoplasm of rectum, rectosigmoid junction, and anus: Secondary | ICD-10-CM | POA: Diagnosis present

## 2019-12-26 DIAGNOSIS — Z79899 Other long term (current) drug therapy: Secondary | ICD-10-CM | POA: Insufficient documentation

## 2019-12-26 DIAGNOSIS — C211 Malignant neoplasm of anal canal: Secondary | ICD-10-CM | POA: Diagnosis not present

## 2019-12-26 DIAGNOSIS — Z87891 Personal history of nicotine dependence: Secondary | ICD-10-CM | POA: Insufficient documentation

## 2019-12-26 DIAGNOSIS — E785 Hyperlipidemia, unspecified: Secondary | ICD-10-CM | POA: Diagnosis not present

## 2019-12-26 DIAGNOSIS — Z9221 Personal history of antineoplastic chemotherapy: Secondary | ICD-10-CM | POA: Insufficient documentation

## 2019-12-26 LAB — COMPREHENSIVE METABOLIC PANEL
ALT: 23 U/L (ref 0–44)
AST: 21 U/L (ref 15–41)
Albumin: 4.5 g/dL (ref 3.5–5.0)
Alkaline Phosphatase: 53 U/L (ref 38–126)
Anion gap: 8 (ref 5–15)
BUN: 20 mg/dL (ref 8–23)
CO2: 28 mmol/L (ref 22–32)
Calcium: 9.1 mg/dL (ref 8.9–10.3)
Chloride: 103 mmol/L (ref 98–111)
Creatinine, Ser: 0.72 mg/dL (ref 0.44–1.00)
GFR calc Af Amer: >60 mL/min (ref >60)
GFR calc non Af Amer: >60 mL/min (ref >60)
Glucose, Bld: 131 mg/dL — ABNORMAL HIGH (ref 70–99)
Potassium: 3.7 mmol/L (ref 3.5–5.1)
Sodium: 139 mmol/L (ref 135–145)
Total Bilirubin: 0.5 mg/dL (ref 0.3–1.2)
Total Protein: 7.4 g/dL (ref 6.5–8.1)

## 2019-12-26 LAB — CBC WITH DIFFERENTIAL/PLATELET
Abs Immature Granulocytes: 0.02 10*3/uL (ref 0.00–0.07)
Basophils Absolute: 0 10*3/uL (ref 0.0–0.1)
Basophils Relative: 1 %
Eosinophils Absolute: 0.2 10*3/uL (ref 0.0–0.5)
Eosinophils Relative: 3 %
HCT: 39.2 % (ref 36.0–46.0)
Hemoglobin: 13.8 g/dL (ref 12.0–15.0)
Immature Granulocytes: 0 %
Lymphocytes Relative: 28 %
Lymphs Abs: 1.7 10*3/uL (ref 0.7–4.0)
MCH: 33 pg (ref 26.0–34.0)
MCHC: 35.2 g/dL (ref 30.0–36.0)
MCV: 93.8 fL (ref 80.0–100.0)
Monocytes Absolute: 0.6 10*3/uL (ref 0.1–1.0)
Monocytes Relative: 10 %
Neutro Abs: 3.6 10*3/uL (ref 1.7–7.7)
Neutrophils Relative %: 58 %
Platelets: 231 10*3/uL (ref 150–400)
RBC: 4.18 MIL/uL (ref 3.87–5.11)
RDW: 12.3 % (ref 11.5–15.5)
WBC: 6 10*3/uL (ref 4.0–10.5)
nRBC: 0 % (ref 0.0–0.2)

## 2020-01-08 ENCOUNTER — Telehealth: Payer: Self-pay | Admitting: Adult Health

## 2020-01-08 NOTE — Telephone Encounter (Signed)
Patient wants someone to go over instruction for medication estrace patient needs further clarification on application instructions.

## 2020-02-03 ENCOUNTER — Other Ambulatory Visit: Payer: Self-pay | Admitting: Adult Health

## 2020-02-03 MED ORDER — ESTRADIOL 0.1 MG/GM VA CREA: TOPICAL_CREAM | VAGINAL | 1 refills | Status: AC

## 2020-02-03 NOTE — Progress Notes (Signed)
Refilled estrace

## 2020-02-12 ENCOUNTER — Telehealth: Payer: Self-pay | Admitting: *Deleted

## 2020-02-12 NOTE — Telephone Encounter (Signed)
Patient called reporting that she has found a red hard swollen area on her labia Majora and is unable to get in to see a GYN any time soon. (2 - 4 weeks) She is concerned about his and is asking if this is something that needs immediate attention or if it can wait. Please advise

## 2020-02-12 NOTE — Telephone Encounter (Signed)
  Can she be seen in the symptom management clinic?  M 

## 2020-02-13 ENCOUNTER — Other Ambulatory Visit: Payer: Self-pay

## 2020-02-13 ENCOUNTER — Inpatient Hospital Stay: Payer: Medicare Other | Attending: Nurse Practitioner | Admitting: Nurse Practitioner

## 2020-02-13 VITALS — BP 148/90 | HR 76 | Temp 98.5°F | Resp 18 | Wt 177.2 lb

## 2020-02-13 DIAGNOSIS — Z85048 Personal history of other malignant neoplasm of rectum, rectosigmoid junction, and anus: Secondary | ICD-10-CM | POA: Diagnosis present

## 2020-02-13 DIAGNOSIS — Z79899 Other long term (current) drug therapy: Secondary | ICD-10-CM | POA: Diagnosis not present

## 2020-02-13 DIAGNOSIS — N764 Abscess of vulva: Secondary | ICD-10-CM

## 2020-02-13 MED ORDER — SULFAMETHOXAZOLE-TRIMETHOPRIM 800-160 MG PO TABS: 1.0000 | ORAL_TABLET | Freq: Two times a day (BID) | ORAL | 0 refills | Status: AC

## 2020-02-13 NOTE — Progress Notes (Signed)
Symptom Management [place]  [phone] [phone]  Patient Care Team: Asencion Noble, MD as PCP - General (Internal Medicine) Lequita Asal, MD as Referring Physician (Hematology and Oncology) [name], MD as Consulting Physician (Gastroenterology)   Name of the patient: Courtney Gallegos  [id number]  January 04, 1956   Date of visit: 02/13/20  Diagnosis-stage III anal squamous cell carcinoma  Chief complaint/ Reason for visit-vulvar lesion  Heme/Onc history:  Oncology History   No history exists.    Interval history-patient presents to symptom management Clinic for reports of new lesion on left labia.  Describes as hard and swollen.  She has previously had abscesses that feels similar to current symptoms and has been treated with antibiotics previously.  No fevers or chills.  No drainage.  No vaginal discharge or pain or bleeding. She's applied warm compresses and taken tylenol for pain.   Review of systems- Review of Systems  Constitutional: Negative for chills, fever, malaise/fatigue and weight loss.  HENT: Negative for hearing loss, nosebleeds, sore throat and tinnitus.   Eyes: Negative for blurred vision and double vision.  Respiratory: Negative for cough, hemoptysis, shortness of breath and wheezing.   Cardiovascular: Negative for chest pain, palpitations and leg swelling.  Gastrointestinal: Negative for abdominal pain, blood in stool, constipation, diarrhea, melena, nausea and vomiting.  Genitourinary: Negative for dysuria and urgency.  Musculoskeletal: Negative for back pain, falls, joint pain and myalgias.  Skin: Negative for itching and rash.  Neurological: Negative for dizziness, tingling, sensory change, loss of consciousness, weakness and headaches.  Endo/Heme/Allergies: Negative for environmental allergies. Does not bruise/bleed easily.  Psychiatric/Behavioral: Negative for depression. The patient is not  nervous/anxious and does not have insomnia.      No Known Allergies  Past Medical History:  Diagnosis Date  . Anal cancer ([place]) 06/07/2019  . Hyperlipidemia 06/07/2019    Past Surgical History:  Procedure Laterality Date  . OSTOMY Left 06/07/2019    Social History   Socioeconomic History  . Marital status: Significant Other    Spouse name: Not on file  . Number of children: 3  . Years of education: Not on file  . Highest education level: Not on file  Occupational History  . Occupation: Retired  Tobacco Use  . Smoking status: Never Smoker  . Smokeless tobacco: Never Used  Vaping Use  . Vaping Use: Never used  Substance and Sexual Activity  . Alcohol use: Yes    Alcohol/week: 2.0 - 4.0 standard drinks    Types: 1 - 2 Glasses of wine, 1 - 2 Shots of liquor per week  . Drug use: Never  . Sexual activity: Yes    Birth control/protection: Surgical, Post-menopausal    Comment: ablation  Other Topics Concern  . Not on file  Social History Narrative  . Not on file   Social Determinants of Health   Financial Resource Strain: Low Risk   . Difficulty of Paying Living Expenses: Not hard at all  Food Insecurity: No Food Insecurity  . Worried About Charity fundraiser in the Last Year: Never true  . Ran Out of Food in the Last Year: Never true  Transportation Needs: No Transportation Needs  . Lack of Transportation (Medical): No  . Lack of Transportation (Non-Medical): No  Physical Activity: Insufficiently Active  . Days of Exercise per Week: 3 days  . Minutes of Exercise per Session: 30 min  Stress: No Stress Concern Present  . Feeling of Stress :  Not at all  Social Connections: Unknown  . Frequency of Communication with Friends and Family: More than three times a week  . Frequency of Social Gatherings with Friends and Family: More than three times a week  . Attends Religious Services: Patient refused  . Active Member of Clubs or Organizations: Patient refused  .  Attends Archivist Meetings: More than 4 times per year  . Marital Status: Living with partner  Intimate Partner Violence: Not At Risk  . Fear of Current or Ex-Partner: No  . Emotionally Abused: No  . Physically Abused: No  . Sexually Abused: No    Family History  Problem Relation Age of Onset  . ALS Mother   . Cancer Father      Current Outpatient Medications:  .  estradiol (ESTRACE) 0.1 MG/GM vaginal cream, Use .05 gm in vagina at bedtime  2-3 x weekly, Disp: 42.5 g, Rfl: 1 .  NON FORMULARY, Take 4 capsules by mouth at bedtime. Gundry  BIO COMPLETE (Gut Supplements, prebiotic probiotic), Disp: , Rfl:  .  rosuvastatin (CRESTOR) 5 MG tablet, Take 5 mg by mouth daily., Disp: , Rfl:  .  BIOTIN PO, Take by mouth. (Patient not taking: Reported on 02/13/2020), Disp: , Rfl:  .  calcium carbonate (OS-CAL - DOSED IN MG OF ELEMENTAL CALCIUM) 1250 (500 Ca) MG tablet, Take 1 tablet by mouth. (Patient not taking: Reported on 02/13/2020), Disp: , Rfl:  .  ergocalciferol (VITAMIN D2) 1.25 MG (50000 UT) capsule, Take 50,000 Units by mouth once a week. (Patient not taking: Reported on 02/13/2020), Disp: , Rfl:  .  Multiple Vitamin (MULTIVITAMIN WITH MINERALS) TABS tablet, Take 1 tablet by mouth daily. (Patient not taking: Reported on 02/13/2020), Disp: , Rfl:   Physical exam:  Vitals:   02/13/20 1136  BP: (!) 148/90  Pulse: 76  Resp: 18  Temp: 98.5 F (36.9 C)  TempSrc: Tympanic  Weight: 177 lb 3.2 oz (80.4 kg)   Physical Exam Exam conducted with a chaperone present.  Constitutional:      General: She is not in acute distress. Genitourinary:    Labia:        Right: No rash, tenderness, lesion or injury.        Left: Tenderness and lesion present. No rash or injury.      Comments: Vulva-left labia majora with abscess at 5:00, < 2cm with surrounding erythema extending up labia.  Skin:    General: Skin is warm and dry.  Neurological:     Mental Status: She is alert and oriented to  person, place, and time.  Psychiatric:        Mood and Affect: Mood normal.        Behavior: Behavior normal.       CMP Latest Ref Rng & Units 12/26/2019  Glucose 70 - 99 mg/dL 131(H)  BUN 8 - 23 mg/dL 20  Creatinine 0.44 - 1.00 mg/dL 0.72  Sodium 135 - 145 mmol/L 139  Potassium 3.5 - 5.1 mmol/L 3.7  Chloride 98 - 111 mmol/L 103  CO2 22 - 32 mmol/L 28  Calcium 8.9 - 10.3 mg/dL 9.1  Total Protein 6.5 - 8.1 g/dL 7.4  Total Bilirubin 0.3 - 1.2 mg/dL 0.5  Alkaline Phos 38 - 126 U/L 53  AST 15 - 41 U/L 21  ALT 0 - 44 U/L 23   CBC Latest Ref Rng & Units 12/26/2019  WBC 4.0 - 10.5 K/uL 6.0  Hemoglobin 12.0 - 15.0 g/dL  13.8  Hematocrit 36 - 46 % 39.2  Platelets 150 - 400 K/uL 231    No images are attached to the encounter.  No results found.  Assessment and plan- Patient is a 64 y.o. female with history of anal cancer currently on surveillance who presents to symptom management clinic for complaints of vulvar pain.  1.  Vulvar abscess- Recommend antibiotic therapy with conservative measures including warm compresses.  No known drug allergies.  Recommend Bactrim DS, 1 tablet BID x 7 days. If no improvement by day 4 consider adding coverage for beta hemolytic strep such as augmentin.    Visit Diagnosis 1. Vulvar abscess     Patient expressed understanding and was in agreement with this plan. She also understands that She can call clinic at any time with any questions, concerns, or complaints.   Thank you for allowing me to participate in the care of this very pleasant patient.   Beckey Rutter, DNP, AGNP-C [facility]

## 2020-02-13 NOTE — Progress Notes (Signed)
Exterior Left labia nodule that is red and tender. Popped up on Tuesday. Also of note she has been on the daily loose stool routine,started on a new Gundry Bio Complete Prebiotic and Probiotics. She takes 4 caps at HS. Now suddenly she is having constipation.

## 2020-02-27 ENCOUNTER — Ambulatory Visit
Admission: RE | Admit: 2020-02-27 | Discharge: 2020-02-27 | Disposition: A | Discharge status: Home / Self Care | Payer: Medicare Other | Source: Ambulatory Visit | Attending: Hematology and Oncology | Admitting: Hematology and Oncology

## 2020-02-27 ENCOUNTER — Other Ambulatory Visit: Payer: Self-pay

## 2020-02-27 DIAGNOSIS — C211 Malignant neoplasm of anal canal: Secondary | ICD-10-CM

## 2020-02-27 LAB — GLUCOSE, CAPILLARY: Glucose-Capillary: 106 mg/dL — ABNORMAL HIGH (ref 70–99)

## 2020-02-27 MED ORDER — FLUDEOXYGLUCOSE F - 18 (FDG) INJECTION
8.2900 | Freq: Once | INTRAVENOUS | Status: AC | PRN
  Administered 2020-02-27: 8.29 via INTRAVENOUS

## 2020-03-03 ENCOUNTER — Inpatient Hospital Stay: Payer: Medicare Other | Admitting: Hematology and Oncology

## 2020-03-09 ENCOUNTER — Ambulatory Visit
Admission: RE | Admit: 2020-03-09 | Discharge: 2020-03-09 | Disposition: A | Discharge status: Home / Self Care | Payer: Medicare Other | Source: Ambulatory Visit | Attending: Hematology and Oncology | Admitting: Hematology and Oncology

## 2020-03-09 ENCOUNTER — Other Ambulatory Visit: Payer: Self-pay

## 2020-03-09 DIAGNOSIS — C211 Malignant neoplasm of anal canal: Secondary | ICD-10-CM | POA: Insufficient documentation

## 2020-03-09 DIAGNOSIS — I700 Atherosclerosis of aorta: Secondary | ICD-10-CM | POA: Insufficient documentation

## 2020-03-09 DIAGNOSIS — Z933 Colostomy status: Secondary | ICD-10-CM | POA: Diagnosis not present

## 2020-03-09 LAB — GLUCOSE, CAPILLARY: Glucose-Capillary: 98 mg/dL (ref 70–99)

## 2020-03-09 MED ORDER — FLUDEOXYGLUCOSE F - 18 (FDG) INJECTION
8.2000 | Freq: Once | INTRAVENOUS | Status: AC | PRN
  Administered 2020-03-09: 8.75 via INTRAVENOUS

## 2020-03-09 NOTE — Progress Notes (Signed)
[facility]  [address] [place], [place] [address] Phone: [phone]  Fax: [phone]   Clinic Day:  03/10/2020  Referring physician: Asencion Noble, MD  Chief Complaint: Courtney Gallegos is a 64 y.o. female with stage III anal squamous cell carcinoma who is seen for a 3 month assessment.  HPI: The patient was last seen in the medical oncology clinic on 02/13/2020. At that time, she denied any abdominal symptoms.  Rectal exam revealed ridging at the 6 o'clock position. She had declined endoscopy by Dr Allen Norris.  PET scan on 03/09/2020 revealed no hypermetabolic metastatic disease. The previously described hypermetabolic right axillary lymph nodes had resolved. There was no hypermetabolic anal recurrence.  There was a small colostomy site ventral left abdominal hernia containing small bowel loops without acute bowel complication.  During the interim, she has been good. She reports constipation. She states that she has pain at the site of her colostomy scar due to a hernia. She had four infections at the area before it fully healed, the most recent was in 05/2019. She would like to speak to a surgeon about potential hernia repair.  The patient states that she is scheduled for a colonoscopy on 04/03/2020.  She is leaving for vacation in [place] in 2 days to visit family and friends.   Past Medical History:  Diagnosis Date  . Anal cancer ([place]) 06/07/2019  . Hyperlipidemia 06/07/2019    Past Surgical History:  Procedure Laterality Date  . OSTOMY Left 06/07/2019    Family History  Problem Relation Age of Onset  . ALS Mother   . Cancer Father     Social History:  reports that she has never smoked. She has never used smokeless tobacco. She reports current alcohol use of about 2.0 - 4.0 standard drinks of alcohol per week. She reports that she does not use drugs. She is currently retired.  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 She was an  ex-smoker, who quit long time ago when she was a teenager. She denies any exposure to radiation outside of treatment. She has no exposure to toxins. She drinks a couple times a week.  Patient used to live in [place], [place]. She lives in [place], [place]. Her daughter and grandchildren liver in [place], [place].  The patient is accompanied by Mikki Santee today.   Allergies: No Known Allergies  Current Medications: Current Outpatient Medications  Medication Sig Dispense Refill  . estradiol (ESTRACE) 0.1 MG/GM vaginal cream Use .05 gm in vagina at bedtime  2-3 x weekly 42.5 g 1  . NON FORMULARY Take 4 capsules by mouth at bedtime. Gundry  BIO COMPLETE (Gut Supplements, prebiotic probiotic)    . rosuvastatin (CRESTOR) 5 MG tablet Take 5 mg by mouth daily.    Marland Kitchen BIOTIN PO Take by mouth. (Patient not taking: Reported on 02/13/2020)    . calcium carbonate (OS-CAL - DOSED IN MG OF ELEMENTAL CALCIUM) 1250 (500 Ca) MG tablet Take 1 tablet by mouth. (Patient not taking: Reported on 02/13/2020)    . ergocalciferol (VITAMIN D2) 1.25 MG (50000 UT) capsule Take 50,000 Units by mouth once a week. (Patient not taking: Reported on 02/13/2020)    . Multiple Vitamin (MULTIVITAMIN WITH MINERALS) TABS tablet Take 1 tablet by mouth daily. (Patient not taking: Reported on 02/13/2020)     No current facility-administered medications for this visit.    Review of Systems  Constitutional: Positive for weight loss (16 lbs). Negative for chills, diaphoresis,  fever and malaise/fatigue.       Feels "good".  HENT: Negative.  Negative for congestion, ear discharge, ear pain, hearing loss, nosebleeds, sinus pain, sore throat and tinnitus.   Eyes: Negative.  Negative for blurred vision.  Respiratory: Negative for cough, hemoptysis, sputum production and shortness of breath.   Cardiovascular: Negative.  Negative for chest pain, palpitations and leg swelling.  Gastrointestinal: Positive for constipation. Negative for abdominal pain, blood in  stool, diarrhea, heartburn, melena, nausea and vomiting.  Genitourinary: Negative for dysuria, frequency, hematuria and urgency.  Musculoskeletal: Negative for back pain, joint pain, myalgias and neck pain.  Skin: Negative for itching and rash.  Neurological: Negative for dizziness, tingling, sensory change, speech change, focal weakness, weakness and headaches.  Endo/Heme/Allergies: Negative for environmental allergies. Does not bruise/bleed easily.  Psychiatric/Behavioral: Negative.  Negative for depression and memory loss. The patient is not nervous/anxious and does not have insomnia.   All other systems reviewed and are negative.  Performance status (ECOG): 0  Vital Signs Blood pressure 102/70, pulse 69, resp. rate 18, height 5' 7.5" (1.715 m), weight 161 lb 2.5 oz (73.1 kg), SpO2 99 %.  Physical Exam Vitals and nursing note reviewed.  Constitutional:      General: She is not in acute distress.    Appearance: She is well-developed. She is not diaphoretic.  HENT:     Head: Normocephalic and atraumatic.     Comments: Long curly graying hair.    Mouth/Throat:     Mouth: Mucous membranes are moist.     Pharynx: Oropharynx is clear.  Eyes:     General: No scleral icterus.    Extraocular Movements: Extraocular movements intact.     Conjunctiva/sclera: Conjunctivae normal.     Pupils: Pupils are equal, round, and reactive to light.     Comments: Blue eyes.  Cardiovascular:     Rate and Rhythm: Normal rate and regular rhythm.     Heart sounds: Normal heart sounds. No murmur heard.   Pulmonary:     Effort: Pulmonary effort is normal. No respiratory distress.     Breath sounds: Normal breath sounds. No wheezing or rales.  Chest:     Chest wall: No tenderness.  Abdominal:     General: Bowel sounds are normal. There is no distension.     Palpations: Abdomen is soft. There is no hepatomegaly, splenomegaly or mass.     Tenderness: There is no abdominal tenderness. There is no  guarding or rebound.     Comments: Scar tissue around old colostomy site.  Musculoskeletal:        General: No swelling or tenderness. Normal range of motion.     Cervical back: Normal range of motion and neck supple.  Lymphadenopathy:     Head:     Right side of head: No preauricular, posterior auricular or occipital adenopathy.     Left side of head: No preauricular, posterior auricular or occipital adenopathy.     Cervical: No cervical adenopathy.     Upper Body:     Right upper body: No supraclavicular or axillary adenopathy.     Left upper body: No supraclavicular or axillary adenopathy.     Lower Body: No right inguinal adenopathy. No left inguinal adenopathy.  Skin:    General: Skin is warm and dry.  Neurological:     Mental Status: She is alert and oriented to person, place, and time. Mental status is at baseline.  Psychiatric:  Mood and Affect: Mood normal.        Behavior: Behavior normal.        Thought Content: Thought content normal.        Judgment: Judgment normal.    Imaging studies: 02/15/2018:  Ultrasound revealed a 2.7 x 3.3 x 3.2 cm complex perineal mass.  03/25/2018:  MRI of the pelvis revealed a 3.2 cm palpable perineal mass invading the distal one third of the posterior vaginal wall extending to the introitus, the adjacent vaginal rectal perineum and the anterior anus extending in close proximity to the rectal anal junction craniocaudally to the introitus and appeared to extend from the 9:00 to 3 o'clock position.  Differential included anal carcinoma or vaginal carcinoma origin.  There was no adenopathy or metastatic disease. 05/17/2018:  PET scan revealed focal anal rectal uptake consistent with her history of malignancy.  There was no hypermetabolic lymph nodes in the abdomen or pelvis.  There was no evidence of metastatic disease. 12/24/2018:  PET scan revealed no evidence of disease.   01/09/2019:  Fluoroscopy barium enema revealed no rectovaginal  fistula.  There was diverticulosis involving the residual sigmoid colon.   03/18/2019:  Chest, abdomen and pelvis CT revealed no evidence of disease recurrence. ss or tenderness of her hands and feet. She had lost some hair and  09/23/2019:  PET scan revealed no evidence of recurrent or metastatic disease.  There were hypermetabolic right/supraclavicular lymph nodes c/w COVID-19 vaccination on 09/20/2019.  There was mild nonspecific hypermetabolism (SUV 3.2) within a  6 mm normal-sized subcarinal lymph node. 03/09/2020:  PET scan revealed no hypermetabolic metastatic disease. The previously described hypermetabolic right axillary lymph nodes had resolved. There was no hypermetabolic anal recurrence.  There was a small colostomy site ventral left abdominal hernia containing small bowel loops without acute bowel complication.   Hospital Outpatient Visit on 03/09/2020  Component Date Value Ref Range Status  . Glucose-Capillary 03/09/2020 98  70 - 99 mg/dL Final   Glucose reference range applies only to samples taken after fasting for at least 8 hours.    Assessment:  Courtney Gallegos is a 64 y.o. female with stage III anal squamous cell carcinoma s/p biopsy on 04/09/2018.  Pathology revealed a moderately differentiated squamous cell carcinoma, P16+.   MRI of the pelvis on 03/25/2018 revealed a 3.2 cm palpable perineal mass invading the distal one third of the posterior vaginal wall extending to the introitus, the adjacent vaginal rectal perineum and the anterior anus extending in close proximity to the rectal anal junction craniocaudally to the introitus and appeared to extend from the 9:00 to 3 o'clock position.   PET scan on 05/17/2018 revealed focal anal rectal uptake consistent with her history of malignancy.  There was no hypermetabolic lymph nodes in the abdomen or pelvis.  There was no evidence of metastatic disease.  She underwent laparoscopic diverting colostomy on 06/05/2018.  She received 2  cycles of capecitabine + mitomycin (07/02/2018 - 07/30/2018) with concurrent radiation  (07/02/2018 - 08/10/2018).  She underwent colostomy reversal on 02/11/2019.  She enrolled on clinical trial [id number] available through the [facility]:  A randomized phase 2 study of nivolumab after combined modality therapy in high risk anal cancer.  She received 4 cycles of nivolumab (09/19/2018 - 12/12/2018).    Chest, abdomen and pelvis CT on 03/18/2019 revealed no evidence of disease recurrence.  PET scan on 09/23/2019 revealed no evidence of recurrent or metastatic disease.  There were hypermetabolic right/supraclavicular lymph  nodes c/w COVID-19 vaccination on 09/20/2019.  There was mild nonspecific hypermetabolism (SUV 3.2) within a  6 mm subcarinal lymph node.  PET scan on 03/09/2020 revealed no hypermetabolic metastatic disease. The previously described hypermetabolic right axillary lymph nodes had resolved.  There was no hypermetabolic anal recurrence.  There was a small colostomy site ventral left abdominal hernia containing small bowel loops without acute bowel complication.  Patient received her last COVID-19 vaccine on 09/20/2019.    Symptomatically,  She has felt "good".  She reports constipation. She has had intermittent pain at the site of her colostomy scar due to a hernia.   Plan: 1.   Review labs from 12/26/2019. 2.   Stage III anal squamous cell carcinoma  She is s/p diverting colostomy and 2 cycles of capecitabine + mitomycin with concurrent radiation.  She is s/p colostomy reversal.  She enrolled on clinical trial [id number] and is s/p 4 cycles of nivolumab.   She has withdrawn from the trial secondary to follow-up needed in [place].  PET scan on 09/23/2019 revealed no evidence of recurrent or metastatic disease.    PET scan on 03/09/2020 was personally reviewed.  Agree with radiology interpretation.   No evidence of recurrent disease.   PET scan cannot substitute for  endoscopy/colonoscopy.  Discuss need for follow-up endoscopy per NCCN guidelines.     She is scheduled for colonoscopy on 04/03/2020.   Continue to monitor patient per NCCN guidelines:   DRE every 3-6 months x 5 years.  Anoscopy 6-12 months x 3 years.  Chest, abdomen and pelvis CT annually x 3 years. 3.   Hernia  Encourage follow-up with surgery. 4.   RTC in 6 months for MD assessment and labs (CBC with diff, CMP).  I discussed the assessment and treatment plan with the patient.  The patient was provided an opportunity to ask questions and all were answered.  The patient agreed with the plan and demonstrated an understanding of the instructions.  The patient was advised to call back if the symptoms worsen or if the condition fails to improve as anticipated.  I provided 21 minutes of face-to-face time during this this encounter and > 50% was spent counseling as documented under my assessment and plan.   Wilhemina Grall C. Mike Gip, MD, PhD    03/10/2020, 3:50 PM  I, Evert Kohl, am acting as a Education administrator for Lequita Asal, MD.  I, Santel Mike Gip, MD, have reviewed the above documentation for accuracy and completeness, and I agree with the above.

## 2020-03-10 ENCOUNTER — Other Ambulatory Visit: Payer: Self-pay

## 2020-03-10 ENCOUNTER — Inpatient Hospital Stay: Payer: Medicare Other | Attending: Hematology and Oncology | Admitting: Hematology and Oncology

## 2020-03-10 ENCOUNTER — Encounter: Payer: Self-pay | Admitting: Hematology and Oncology

## 2020-03-10 VITALS — BP 102/70 | HR 69 | Resp 18 | Ht 67.5 in | Wt 161.2 lb

## 2020-03-10 DIAGNOSIS — Z79899 Other long term (current) drug therapy: Secondary | ICD-10-CM | POA: Insufficient documentation

## 2020-03-10 DIAGNOSIS — C211 Malignant neoplasm of anal canal: Secondary | ICD-10-CM | POA: Diagnosis present

## 2020-03-10 NOTE — Progress Notes (Signed)
No new changes noted today 

## 2020-03-17 ENCOUNTER — Other Ambulatory Visit: Payer: Medicare Other

## 2020-03-19 ENCOUNTER — Ambulatory Visit: Payer: Medicare Other | Admitting: Hematology and Oncology

## 2020-03-31 ENCOUNTER — Telehealth: Payer: Self-pay

## 2020-03-31 ENCOUNTER — Other Ambulatory Visit: Payer: Self-pay

## 2020-03-31 DIAGNOSIS — Z1212 Encounter for screening for malignant neoplasm of rectum: Secondary | ICD-10-CM

## 2020-03-31 DIAGNOSIS — Z1211 Encounter for screening for malignant neoplasm of colon: Secondary | ICD-10-CM

## 2020-03-31 MED ORDER — NA SULFATE-K SULFATE-MG SULF 17.5-3.13-1.6 GM/177ML PO SOLN: 1.0000 | Freq: Once | ORAL | 0 refills | Status: AC

## 2020-03-31 NOTE — Telephone Encounter (Signed)
Patient has been notified of COVID testing on 04/01/20 at [facility] in [place] at 10:45am.  This is for her Colonoscopy scheduled with Dr. Allen Norris on 04/03/20.  Thanks,  [place], [place]

## 2020-03-31 NOTE — Telephone Encounter (Signed)
Patient instructions were mailed and sent via email. Verbally explained procedure instructions as well.

## 2020-04-01 ENCOUNTER — Other Ambulatory Visit: Payer: Self-pay

## 2020-04-01 ENCOUNTER — Encounter: Payer: Self-pay | Admitting: Gastroenterology

## 2020-04-01 ENCOUNTER — Other Ambulatory Visit (HOSPITAL_COMMUNITY)
Admission: RE | Admit: 2020-04-01 | Discharge: 2020-04-01 | Disposition: A | Discharge status: Home / Self Care | Payer: Medicare Other | Source: Ambulatory Visit | Attending: Gastroenterology | Admitting: Gastroenterology

## 2020-04-01 DIAGNOSIS — Z01812 Encounter for preprocedural laboratory examination: Secondary | ICD-10-CM | POA: Diagnosis present

## 2020-04-01 DIAGNOSIS — Z20822 Contact with and (suspected) exposure to covid-19: Secondary | ICD-10-CM | POA: Diagnosis not present

## 2020-04-01 LAB — SARS CORONAVIRUS 2 (TAT 6-24 HRS): SARS Coronavirus 2: NEGATIVE

## 2020-04-01 NOTE — Discharge Instructions (Signed)
General Anesthesia, Adult, Care After This sheet gives you information about how to care for yourself after your procedure. Your health care provider may also give you more specific instructions. If you have problems or questions, contact your health care provider. What can I expect after the procedure? After the procedure, the following side effects are common:  Pain or discomfort at the IV site.  Nausea.  Vomiting.  Sore throat.  Trouble concentrating.  Feeling cold or chills.  Weak or tired.  Sleepiness and fatigue.  Soreness and body aches. These side effects can affect parts of the body that were not involved in surgery. Follow these instructions at home:  For at least 24 hours after the procedure:  Have a responsible adult stay with you. It is important to have someone help care for you until you are awake and alert.  Rest as needed.  Do not: ? Participate in activities in which you could fall or become injured. ? Drive. ? Use heavy machinery. ? Drink alcohol. ? Take sleeping pills or medicines that cause drowsiness. ? Make important decisions or sign legal documents. ? Take care of children on your own. Eating and drinking  Follow any instructions from your health care provider about eating or drinking restrictions.  When you feel hungry, start by eating small amounts of foods that are soft and easy to digest (bland), such as toast. Gradually return to your regular diet.  Drink enough fluid to keep your urine pale yellow.  If you vomit, rehydrate by drinking water, juice, or clear broth. General instructions  If you have sleep apnea, surgery and certain medicines can increase your risk for breathing problems. Follow instructions from your health care provider about wearing your sleep device: ? Anytime you are sleeping, including during daytime naps. ? While taking prescription pain medicines, sleeping medicines, or medicines that make you drowsy.  Return to  your normal activities as told by your health care provider. Ask your health care provider what activities are safe for you.  Take over-the-counter and prescription medicines only as told by your health care provider.  If you smoke, do not smoke without supervision.  Keep all follow-up visits as told by your health care provider. This is important. Contact a health care provider if:  You have nausea or vomiting that does not get better with medicine.  You cannot eat or drink without vomiting.  You have pain that does not get better with medicine.  You are unable to pass urine.  You develop a skin rash.  You have a fever.  You have redness around your IV site that gets worse. Get help right away if:  You have difficulty breathing.  You have chest pain.  You have blood in your urine or stool, or you vomit blood. Summary  After the procedure, it is common to have a sore throat or nausea. It is also common to feel tired.  Have a responsible adult stay with you for the first 24 hours after general anesthesia. It is important to have someone help care for you until you are awake and alert.  When you feel hungry, start by eating small amounts of foods that are soft and easy to digest (bland), such as toast. Gradually return to your regular diet.  Drink enough fluid to keep your urine pale yellow.  Return to your normal activities as told by your health care provider. Ask your health care provider what activities are safe for you. This information is not   intended to replace advice given to you by your health care provider. Make sure you discuss any questions you have with your health care provider. Document Revised: 06/16/2017 Document Reviewed: 01/27/2017 Elsevier Patient Education  2020 Elsevier Inc.  

## 2020-04-03 ENCOUNTER — Ambulatory Visit
Admission: RE | Admit: 2020-04-03 | Discharge: 2020-04-03 | Disposition: A | Discharge status: Home / Self Care | Payer: Medicare Other | Attending: Gastroenterology | Admitting: Gastroenterology

## 2020-04-03 ENCOUNTER — Ambulatory Visit: Payer: Medicare Other | Admitting: Anesthesiology

## 2020-04-03 ENCOUNTER — Encounter: Payer: Self-pay | Admitting: Gastroenterology

## 2020-04-03 ENCOUNTER — Other Ambulatory Visit: Payer: Self-pay

## 2020-04-03 ENCOUNTER — Encounter
Admission: RE | Disposition: A | Discharge status: Home / Self Care | Payer: Self-pay | Source: Home / Self Care | Attending: Gastroenterology

## 2020-04-03 DIAGNOSIS — Z1212 Encounter for screening for malignant neoplasm of rectum: Secondary | ICD-10-CM | POA: Diagnosis not present

## 2020-04-03 DIAGNOSIS — H9193 Unspecified hearing loss, bilateral: Secondary | ICD-10-CM | POA: Insufficient documentation

## 2020-04-03 DIAGNOSIS — Z79890 Hormone replacement therapy (postmenopausal): Secondary | ICD-10-CM | POA: Diagnosis not present

## 2020-04-03 DIAGNOSIS — Z79899 Other long term (current) drug therapy: Secondary | ICD-10-CM | POA: Insufficient documentation

## 2020-04-03 DIAGNOSIS — Z87891 Personal history of nicotine dependence: Secondary | ICD-10-CM | POA: Diagnosis not present

## 2020-04-03 DIAGNOSIS — Z85048 Personal history of other malignant neoplasm of rectum, rectosigmoid junction, and anus: Secondary | ICD-10-CM | POA: Diagnosis not present

## 2020-04-03 DIAGNOSIS — E785 Hyperlipidemia, unspecified: Secondary | ICD-10-CM | POA: Insufficient documentation

## 2020-04-03 DIAGNOSIS — Z1211 Encounter for screening for malignant neoplasm of colon: Secondary | ICD-10-CM | POA: Insufficient documentation

## 2020-04-03 DIAGNOSIS — K5730 Diverticulosis of large intestine without perforation or abscess without bleeding: Secondary | ICD-10-CM | POA: Diagnosis not present

## 2020-04-03 HISTORY — DX: Unspecified hearing loss, bilateral: H91.93

## 2020-04-03 HISTORY — PX: COLONOSCOPY WITH PROPOFOL: SHX5780

## 2020-04-03 SURGERY — COLONOSCOPY WITH PROPOFOL
Anesthesia: General | Site: Rectum

## 2020-04-03 MED ORDER — PROPOFOL 10 MG/ML IV BOLUS
INTRAVENOUS | Status: DC | PRN
  Administered 2020-04-03: 50 mg via INTRAVENOUS
  Administered 2020-04-03: 100 mg via INTRAVENOUS
  Administered 2020-04-03: 50 mg via INTRAVENOUS

## 2020-04-03 MED ORDER — LACTATED RINGERS IV SOLN: INTRAVENOUS | Status: DC

## 2020-04-03 MED ORDER — LIDOCAINE HCL (CARDIAC) PF 100 MG/5ML IV SOSY
PREFILLED_SYRINGE | INTRAVENOUS | Status: DC | PRN
  Administered 2020-04-03: 50 mg via INTRAVENOUS

## 2020-04-03 MED ORDER — STERILE WATER FOR IRRIGATION IR SOLN
Status: DC | PRN
  Administered 2020-04-03: .05 mL

## 2020-04-03 MED ORDER — SODIUM CHLORIDE 0.9 % IV SOLN: INTRAVENOUS | Status: DC

## 2020-04-03 SURGICAL SUPPLY — 6 items
GOWN CVR UNV OPN BCK APRN NK (MISCELLANEOUS) ×2 IMPLANT
GOWN ISOL THUMB LOOP REG UNIV (MISCELLANEOUS) ×6
KIT PRC NS LF DISP ENDO (KITS) ×1 IMPLANT
KIT PROCEDURE OLYMPUS (KITS) ×3
MANIFOLD NEPTUNE II (INSTRUMENTS) ×3 IMPLANT
WATER STERILE IRR 250ML POUR (IV SOLUTION) ×3 IMPLANT

## 2020-04-03 NOTE — Op Note (Addendum)
[facility] Patient Name: Courtney Gallegos Procedure Date: 04/03/2020 11:03 AM MRN: [id number] Account #: [id number] Date of Birth: 1955/10/09 Admit Type: Outpatient Age: 64 Room: [facility] OR ROOM 01 Gender: Female Note Status: Supervisor Override Procedure:             Colonoscopy Indications:           Screening for colorectal malignant neoplasm Providers:             [name] MD, MD Referring MD:          Asencion Noble (Referring MD) Medicines:             Propofol per Anesthesia Complications:         No immediate complications. Procedure:             Pre-Anesthesia Assessment:                        - Prior to the procedure, a History and Physical was                         performed, and patient medications and allergies were                         reviewed. The patient's tolerance of previous                         anesthesia was also reviewed. The risks and benefits                         of the procedure and the sedation options and risks                         were discussed with the patient. All questions were                         answered, and informed consent was obtained. Prior                         Anticoagulants: The patient has taken no previous                         anticoagulant or antiplatelet agents. ASA Grade                         Assessment: II - A patient with mild systemic disease.                         After reviewing the risks and benefits, the patient                         was deemed in satisfactory condition to undergo the                         procedure.                        After obtaining informed consent, the colonoscope was  passed under direct vision. Throughout the procedure,                         the patient's blood pressure, pulse, and oxygen                         saturations were monitored continuously. The was                         introduced through the anus and  advanced to the the                         cecum, identified by appendiceal orifice and ileocecal                         valve. The colonoscopy was performed without                         difficulty. The patient tolerated the procedure well.                         The quality of the bowel preparation was excellent. Findings:      The perianal and digital rectal examinations were normal.      Multiple small-mouthed diverticula were found in the sigmoid colon.      The exam was otherwise without abnormality. Impression:            - Diverticulosis in the sigmoid colon.                        - The examination was otherwise normal.                        - No specimens collected. Recommendation:        - Discharge patient to home.                        - Resume previous diet.                        - Continue present medications.                        - Repeat colonoscopy in 5 years for surveillance. Procedure Code(s):     --- Professional ---                        [phone], Colonoscopy, flexible; diagnostic, including                         collection of specimen(s) by brushing or washing, when                         performed (separate procedure) Diagnosis Code(s):     --- Professional ---                        Z12.11, Encounter for screening for malignant neoplasm                         of colon CPT copyright  2019 American Medical Association. All rights reserved. The codes documented in this report are preliminary and upon coder review may  be revised to meet current compliance requirements. [name] MD, MD 04/03/2020 11:34:05 AM This report has been signed electronically. Number of Addenda: 0 Note Initiated On: 04/03/2020 11:03 AM Scope Withdrawal Time: 0 hours 7 minutes 52 seconds  Total Procedure Duration: 0 hours 17 minutes 24 seconds  Estimated Blood Loss:  Estimated blood loss: none.      [facility]

## 2020-04-03 NOTE — Anesthesia Postprocedure Evaluation (Signed)
Anesthesia Post Note  Patient: Courtney Gallegos  Procedure(s) Performed: COLONOSCOPY WITH PROPOFOL (N/A Rectum)     Patient location during evaluation: PACU Anesthesia Type: General Level of consciousness: awake and alert Pain management: pain level controlled Vital Signs Assessment: post-procedure vital signs reviewed and stable Respiratory status: spontaneous breathing, nonlabored ventilation, respiratory function stable and patient connected to nasal cannula oxygen Cardiovascular status: blood pressure returned to baseline and stable Postop Assessment: no apparent nausea or vomiting Anesthetic complications: no   No complications documented.  Adele Barthel Ravon Mcilhenny

## 2020-04-03 NOTE — H&P (Signed)
Courtney Lame, MD [facility] [address] [place],  [address] Phone: [phone] Fax : [phone]  Primary Care Physician:  Asencion Noble, MD Primary Gastroenterologist:  Dr. Allen Norris  Pre-Procedure History & Physical: HPI:  Courtney Gallegos is a 64 y.o. female is here for a screening colonoscopy.   Past Medical History:  Diagnosis Date  . Anal cancer ([place]) 06/07/2019  . Hearing deficit, bilateral    Mild, has hearing aids, does not wear.  . Hyperlipidemia 06/07/2019    Past Surgical History:  Procedure Laterality Date  . OSTOMY Left 06/07/2019    Prior to Admission medications   Medication Sig Start Date End Date Taking? Authorizing Provider  BIOTIN PO Take by mouth.    Yes [provider]  calcium carbonate (OS-CAL - DOSED IN MG OF ELEMENTAL CALCIUM) 1250 (500 Ca) MG tablet Take 1 tablet by mouth.    Yes [provider]  ergocalciferol (VITAMIN D2) 1.25 MG (50000 UT) capsule Take 50,000 Units by mouth once a week.    Yes [provider]  estradiol (ESTRACE) 0.1 MG/GM vaginal cream Use .05 gm in vagina at bedtime  2-3 x weekly 02/03/20  Yes Estill Dooms, NP  Multiple Vitamin (MULTIVITAMIN WITH MINERALS) TABS tablet Take 1 tablet by mouth daily.    Yes [provider]  NON FORMULARY Take 4 capsules by mouth at bedtime. [place]  BIO COMPLETE (Gut Supplements, prebiotic probiotic)   Yes [provider]  psyllium (METAMUCIL) 58.6 % packet Take 1 packet by mouth daily.   Yes [provider]  rosuvastatin (CRESTOR) 5 MG tablet Take 5 mg by mouth daily. 10/09/19  Yes [provider]    Allergies as of 03/31/2020  . (No Known Allergies)    Family History  Problem Relation Age of Onset  . ALS Mother   . Cancer Father     Social History   Socioeconomic History  . Marital status: Significant Other    Spouse name: Not on file  . Number of children: 3  . Years of education: Not on file  . Highest  education level: Not on file  Occupational History  . Occupation: Retired  Tobacco Use  . Smoking status: Former Smoker    Years: 2.00    Quit date: 2012    Years since quitting: 9.7  . Smokeless tobacco: Never Used  Vaping Use  . Vaping Use: Never used  Substance and Sexual Activity  . Alcohol use: Yes    Alcohol/week: 2.0 - 4.0 standard drinks    Types: 1 - 2 Glasses of wine, 1 - 2 Shots of liquor per week  . Drug use: Never  . Sexual activity: Yes    Birth control/protection: Surgical, Post-menopausal    Comment: ablation  Other Topics Concern  . Not on file  Social History Narrative  . Not on file   Social Determinants of Health   Financial Resource Strain: Low Risk   . Difficulty of Paying Living Expenses: Not hard at all  Food Insecurity: No Food Insecurity  . Worried About Charity fundraiser in the Last Year: Never true  . Ran Out of Food in the Last Year: Never true  Transportation Needs: No Transportation Needs  . Lack of Transportation (Medical): No  . Lack of Transportation (Non-Medical): No  Physical Activity: Insufficiently Active  . Days of Exercise per Week: 3 days  . Minutes of Exercise per Session: 30 min  Stress: No Stress Concern Present  .  Feeling of Stress : Not at all  Social Connections: Unknown  . Frequency of Communication with Friends and Family: More than three times a week  . Frequency of Social Gatherings with Friends and Family: More than three times a week  . Attends Religious Services: Patient refused  . Active Member of Clubs or Organizations: Patient refused  . Attends Archivist Meetings: More than 4 times per year  . Marital Status: Living with partner  Intimate Partner Violence: Not At Risk  . Fear of Current or Ex-Partner: No  . Emotionally Abused: No  . Physically Abused: No  . Sexually Abused: No    Review of Systems: See HPI, otherwise negative ROS  Physical Exam: BP 128/84   Pulse 72   Temp (!) 97 F (36.1  C)   Ht 5' 7.5" (1.715 m)   Wt 71.2 kg   SpO2 98%   BMI 24.23 kg/m  General:   Alert,  pleasant and cooperative in NAD Head:  Normocephalic and atraumatic. Neck:  Supple; no masses or thyromegaly. Lungs:  Clear throughout to auscultation.    Heart:  Regular rate and rhythm. Abdomen:  Soft, nontender and nondistended. Normal bowel sounds, without guarding, and without rebound.   Neurologic:  Alert and  oriented x4;  grossly normal neurologically.  Impression/Plan: Courtney Gallegos is now here to undergo a screening colonoscopy.  Risks, benefits, and alternatives regarding colonoscopy have been reviewed with the patient.  Questions have been answered.  All parties agreeable.

## 2020-04-03 NOTE — Transfer of Care (Signed)
Immediate Anesthesia Transfer of Care Note  Patient: Courtney Gallegos  Procedure(s) Performed: COLONOSCOPY WITH PROPOFOL (N/A Rectum)  Patient Location: PACU  Anesthesia Type: General  Level of Consciousness: awake, alert  and patient cooperative  Airway and Oxygen Therapy: Patient Spontanous Breathing and Patient connected to supplemental oxygen  Post-op Assessment: Post-op Vital signs reviewed, Patient's Cardiovascular Status Stable, Respiratory Function Stable, Patent Airway and No signs of Nausea or vomiting  Post-op Vital Signs: Reviewed and stable  Complications: No complications documented.

## 2020-04-03 NOTE — Anesthesia Procedure Notes (Signed)
Procedure Name: General with mask airway Date/Time: 04/03/2020 11:16 AM Performed by: Jeannene Patella, CRNA Pre-anesthesia Checklist: Timeout performed, Patient being monitored, Suction available, Emergency Drugs available and Patient identified Patient Re-evaluated:Patient Re-evaluated prior to induction Oxygen Delivery Method: Nasal cannula

## 2020-04-03 NOTE — Anesthesia Preprocedure Evaluation (Signed)
Anesthesia Evaluation  Patient identified by MRN, date of birth, ID band Patient awake    History of Anesthesia Complications Negative for: history of anesthetic complications  Airway Mallampati: II  TM Distance: >3 FB Neck ROM: Full    Dental no notable dental hx.    Pulmonary former smoker,    Pulmonary exam normal        Cardiovascular Exercise Tolerance: Good negative cardio ROS Normal cardiovascular exam     Neuro/Psych negative neurological ROS     GI/Hepatic negative GI ROS, Neg liver ROS,   Endo/Other  negative endocrine ROS  Renal/GU negative Renal ROS     Musculoskeletal   Abdominal   Peds  Hematology negative hematology ROS (+)   Anesthesia Other Findings   Reproductive/Obstetrics                             Anesthesia Physical Anesthesia Plan  ASA: I  Anesthesia Plan: General   Post-op Pain Management:    Induction: Intravenous  PONV Risk Score and Plan: 3 and Propofol infusion, TIVA and Treatment may vary due to age or medical condition  Airway Management Planned: Nasal Cannula and Natural Airway  Additional Equipment: None  Intra-op Plan:   Post-operative Plan:   Informed Consent: I have reviewed the patients History and Physical, chart, labs and discussed the procedure including the risks, benefits and alternatives for the proposed anesthesia with the patient or authorized representative who has indicated his/her understanding and acceptance.       Plan Discussed with: CRNA  Anesthesia Plan Comments:         Anesthesia Quick Evaluation

## 2020-04-06 ENCOUNTER — Encounter: Payer: Self-pay | Admitting: Gastroenterology

## 2020-08-21 ENCOUNTER — Telehealth: Payer: Self-pay | Admitting: *Deleted

## 2020-08-21 NOTE — Telephone Encounter (Signed)
08/21/2020 Called pt and informed her f/u appts were made for 3/23 per MD. Pt confirmed appts  SRW

## 2020-08-21 NOTE — Telephone Encounter (Signed)
Patient called stating that it is time for her 6 month PET scan. She currently has no appts scheduled. I sent message to schedulers from last visit deposition  Follow-up and Disposition History  03/10/2020 1607 - Lequita Asal, MD  Check-out note:  RTC in 6 months for MD assess and labs (CBC with diff, CMP).  -mcc

## 2020-08-21 NOTE — Telephone Encounter (Signed)
  We can set her up for an appt with labs.  PTE scan to follow if covered.  M

## 2020-09-08 ENCOUNTER — Ambulatory Visit: Payer: Medicare Other | Admitting: Hematology and Oncology

## 2020-09-08 ENCOUNTER — Other Ambulatory Visit: Payer: Medicare Other

## 2020-09-15 ENCOUNTER — Other Ambulatory Visit: Payer: Self-pay | Admitting: *Deleted

## 2020-09-15 DIAGNOSIS — C211 Malignant neoplasm of anal canal: Secondary | ICD-10-CM

## 2020-09-15 NOTE — Progress Notes (Signed)
[facility]  [address] [place], [place] [address] Phone: [phone]  Fax: [phone]   Clinic Day:  09/16/2020  Referring physician: Asencion Noble, MD  Chief Complaint: Courtney Gallegos is a 65 y.o. female with stage III anal squamous cell carcinoma who is seen for 6 month assessment.  HPI: The patient was last seen in the medical oncology clinic on 03/10/2020. At that time, she felt "good".  She reported constipation. She had intermittent pain at the site of her colostomy scar due to a hernia.  Colonoscopy on 04/03/2020 by Dr. Allen Norris revealed diverticulosis in the sigmoid colon. No specimens were collected.  During the interim, she has been "good." She is still having discomfort at the site of her colostomy scar. She had physical therapy after her colostomy reversal but was only able to go a few times before it was shut down due to COVID-19. She would like to start PT again, preferably in [place]. She is having some trouble with incontinence.   The patient has started a diet. Her goal weight is 135-140 lbs.  The patient is using vaginal estrogen cream. She stopped using it for a little while and tried intercourse, but it was painful and caused vaginal bleeding. She started using the cream again.   Past Medical History:  Diagnosis Date  . Anal cancer ([place]) 06/07/2019  . Hearing deficit, bilateral    Mild, has hearing aids, does not wear.  . Hyperlipidemia 06/07/2019    Past Surgical History:  Procedure Laterality Date  . COLONOSCOPY WITH PROPOFOL N/A 04/03/2020   Procedure: COLONOSCOPY WITH PROPOFOL;  Surgeon: [name], MD;  Location: [facility];  Service: Endoscopy;  Laterality: N/A;  Priority 4  . OSTOMY Left 06/07/2019    Family History  Problem Relation Age of Onset  . ALS Mother   . Cancer Father     Social History:  reports that she quit smoking about 10 years ago. She quit after 2.00 years of use. She has never used  smokeless tobacco. She reports previous alcohol use of about 2.0 - 4.0 standard drinks of alcohol per week. She reports that she does not use drugs. She is currently retired.  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 She was an ex-smoker, who quit long time ago when she was a teenager. She denies any exposure to radiation outside of treatment. She has no exposure to toxins. She drinks a couple times a week.  Patient used to live in [place], [place]. She lives in [place], [place]. Her daughter and grandchildren liver in [place], [place].  The patient is alone today.   Allergies: No Known Allergies  Current Medications: Current Outpatient Medications  Medication Sig Dispense Refill  . BIOTIN PO Take by mouth.     . calcium carbonate (OS-CAL - DOSED IN MG OF ELEMENTAL CALCIUM) 1250 (500 Ca) MG tablet Take 1 tablet by mouth.     . ergocalciferol (VITAMIN D2) 1.25 MG (50000 UT) capsule Take 50,000 Units by mouth once a week.     . estradiol (ESTRACE) 0.1 MG/GM vaginal cream Use .05 gm in vagina at bedtime  2-3 x weekly 42.5 g 1  . Multiple Vitamin (MULTIVITAMIN WITH MINERALS) TABS tablet Take 1 tablet by mouth daily.     . psyllium (METAMUCIL) 58.6 % packet Take 1 packet by mouth daily.    . rosuvastatin (CRESTOR) 5 MG tablet Take 5 mg by mouth daily.     No  current facility-administered medications for this visit.    Review of Systems  Constitutional: Negative.  Negative for chills, diaphoresis, fever, malaise/fatigue and weight loss (stable).       Feels "good".  HENT: Negative.  Negative for congestion, ear discharge, ear pain, hearing loss, nosebleeds, sinus pain, sore throat and tinnitus.   Eyes: Negative.  Negative for blurred vision.  Respiratory: Negative for cough, hemoptysis, sputum production and shortness of breath.   Cardiovascular: Negative.  Negative for chest pain, palpitations and leg swelling.  Gastrointestinal: Negative.  Negative for abdominal pain, blood in stool,  constipation, diarrhea, heartburn, melena, nausea and vomiting.  Genitourinary: Negative for dysuria, frequency, hematuria and urgency.       Incontinence.  Musculoskeletal: Negative.  Negative for back pain, joint pain, myalgias and neck pain.  Skin: Negative.  Negative for itching and rash.  Neurological: Negative.  Negative for dizziness, tingling, sensory change, weakness and headaches.  Endo/Heme/Allergies: Negative.  Negative for environmental allergies. Does not bruise/bleed easily.  Psychiatric/Behavioral: Negative.  Negative for depression and memory loss. The patient is not nervous/anxious and does not have insomnia.   All other systems reviewed and are negative.  Performance status (ECOG): 0  Vital Signs Blood pressure (!) 106/93, pulse 77, temperature (!) 96.7 F (35.9 C), temperature source Tympanic, resp. rate 16, weight 161 lb 15.2 oz (73.5 kg), SpO2 98 %.  Physical Exam Vitals and nursing note reviewed.  Constitutional:      General: She is not in acute distress.    Appearance: She is well-developed. She is not diaphoretic.  HENT:     Head: Normocephalic and atraumatic.     Comments: Long curly graying hair.    Mouth/Throat:     Mouth: Mucous membranes are moist.     Pharynx: Oropharynx is clear.  Eyes:     General: No scleral icterus.    Extraocular Movements: Extraocular movements intact.     Conjunctiva/sclera: Conjunctivae normal.     Pupils: Pupils are equal, round, and reactive to light.     Comments: Blue eyes.  Cardiovascular:     Rate and Rhythm: Normal rate and regular rhythm.     Heart sounds: Normal heart sounds. No murmur heard.   Pulmonary:     Effort: Pulmonary effort is normal. No respiratory distress.     Breath sounds: Normal breath sounds. No wheezing or rales.  Chest:     Chest wall: No tenderness.  Breasts:     Right: No axillary adenopathy or supraclavicular adenopathy.     Left: No axillary adenopathy or supraclavicular adenopathy.     Abdominal:     General: Bowel sounds are normal. There is no distension.     Palpations: Abdomen is soft. There is no hepatomegaly, splenomegaly or mass.     Tenderness: There is abdominal tenderness. There is no guarding or rebound.  Musculoskeletal:        General: No swelling or tenderness. Normal range of motion.     Cervical back: Normal range of motion and neck supple.  Lymphadenopathy:     Head:     Right side of head: No preauricular, posterior auricular or occipital adenopathy.     Left side of head: No preauricular, posterior auricular or occipital adenopathy.     Cervical: No cervical adenopathy.     Upper Body:     Right upper body: No supraclavicular or axillary adenopathy.     Left upper body: No supraclavicular or axillary adenopathy.  Lower Body: No right inguinal adenopathy. No left inguinal adenopathy.  Skin:    General: Skin is warm and dry.  Neurological:     Mental Status: She is alert and oriented to person, place, and time. Mental status is at baseline.  Psychiatric:        Mood and Affect: Mood normal.        Behavior: Behavior normal.        Thought Content: Thought content normal.        Judgment: Judgment normal.    Imaging studies: 02/15/2018:  Ultrasound revealed a 2.7 x 3.3 x 3.2 cm complex perineal mass.  03/25/2018:  MRI of the pelvis revealed a 3.2 cm palpable perineal mass invading the distal one third of the posterior vaginal wall extending to the introitus, the adjacent vaginal rectal perineum and the anterior anus extending in close proximity to the rectal anal junction craniocaudally to the introitus and appeared to extend from the 9:00 to 3 o'clock position.  Differential included anal carcinoma or vaginal carcinoma origin.  There was no adenopathy or metastatic disease. 05/17/2018:  PET scan revealed focal anal rectal uptake consistent with her history of malignancy.  There was no hypermetabolic lymph nodes in the abdomen or pelvis.   There was no evidence of metastatic disease. 12/24/2018:  PET scan revealed no evidence of disease.   01/09/2019:  Fluoroscopy barium enema revealed no rectovaginal fistula.  There was diverticulosis involving the residual sigmoid colon.   03/18/2019:  Chest, abdomen and pelvis CT revealed no evidence of disease recurrence. ss or tenderness of her hands and feet. She had lost some hair and  09/23/2019:  PET scan revealed no evidence of recurrent or metastatic disease.  There were hypermetabolic right/supraclavicular lymph nodes c/w COVID-19 vaccination on 09/20/2019.  There was mild nonspecific hypermetabolism (SUV 3.2) within a  6 mm normal-sized subcarinal lymph node. 03/09/2020:  PET scan revealed no hypermetabolic metastatic disease. The previously described hypermetabolic right axillary lymph nodes had resolved. There was no hypermetabolic anal recurrence.  There was a small colostomy site ventral left abdominal hernia containing small bowel loops without acute bowel complication.   Appointment on 09/16/2020  Component Date Value Ref Range Status  . Sodium 09/16/2020 143  135 - 145 mmol/L Final  . Potassium 09/16/2020 4.2  3.5 - 5.1 mmol/L Final  . Chloride 09/16/2020 107  98 - 111 mmol/L Final  . CO2 09/16/2020 27  22 - 32 mmol/L Final  . Glucose, Bld 09/16/2020 98  70 - 99 mg/dL Final   Glucose reference range applies only to samples taken after fasting for at least 8 hours.  . BUN 09/16/2020 17  8 - 23 mg/dL Final  . Creatinine, Ser 09/16/2020 0.64  0.44 - 1.00 mg/dL Final  . Calcium 09/16/2020 9.6  8.9 - 10.3 mg/dL Final  . Total Protein 09/16/2020 7.4  6.5 - 8.1 g/dL Final  . Albumin 09/16/2020 4.4  3.5 - 5.0 g/dL Final  . AST 09/16/2020 23  15 - 41 U/L Final  . ALT 09/16/2020 22  0 - 44 U/L Final  . Alkaline Phosphatase 09/16/2020 63  38 - 126 U/L Final  . Total Bilirubin 09/16/2020 0.4  0.3 - 1.2 mg/dL Final  . GFR, Estimated 09/16/2020 >60  >60 mL/min Final   Comment:  (NOTE) Calculated using the CKD-EPI Creatinine Equation (2021)   . Anion gap 09/16/2020 9  5 - 15 Final   Performed at [facility], [place]  [address] [place], [place] [address]  . WBC 09/16/2020 7.4  4.0 - 10.5 K/uL Final  . RBC 09/16/2020 4.40  3.87 - 5.11 MIL/uL Final  . Hemoglobin 09/16/2020 14.3  12.0 - 15.0 g/dL Final  . HCT 09/16/2020 41.4  36.0 - 46.0 % Final  . MCV 09/16/2020 94.1  80.0 - 100.0 fL Final  . MCH 09/16/2020 32.5  26.0 - 34.0 pg Final  . MCHC 09/16/2020 34.5  30.0 - 36.0 g/dL Final  . RDW 09/16/2020 12.1  11.5 - 15.5 % Final  . Platelets 09/16/2020 258  150 - 400 K/uL Final  . nRBC 09/16/2020 0.0  0.0 - 0.2 % Final  . Neutrophils Relative % 09/16/2020 58  % Final  . Neutro Abs 09/16/2020 4.3  1.7 - 7.7 K/uL Final  . Lymphocytes Relative 09/16/2020 26  % Final  . Lymphs Abs 09/16/2020 1.9  0.7 - 4.0 K/uL Final  . Monocytes Relative 09/16/2020 12  % Final  . Monocytes Absolute 09/16/2020 0.9  0.1 - 1.0 K/uL Final  . Eosinophils Relative 09/16/2020 3  % Final  . Eosinophils Absolute 09/16/2020 0.3  0.0 - 0.5 K/uL Final  . Basophils Relative 09/16/2020 1  % Final  . Basophils Absolute 09/16/2020 0.1  0.0 - 0.1 K/uL Final  . Immature Granulocytes 09/16/2020 0  % Final  . Abs Immature Granulocytes 09/16/2020 0.02  0.00 - 0.07 K/uL Final   Performed at [facility], [address], [place],  [address]    Assessment:  Courtney Gallegos is a 65 y.o. female with stage III anal squamous cell carcinoma s/p biopsy on 04/09/2018.  Pathology revealed a moderately differentiated squamous cell carcinoma, P16+.   MRI of the pelvis on 03/25/2018 revealed a 3.2 cm palpable perineal mass invading the distal one third of the posterior vaginal wall extending to the introitus, the adjacent vaginal rectal perineum and the anterior anus extending in close proximity to the rectal anal junction craniocaudally to the introitus and appeared to extend from the 9:00  to 3 o'clock position.   PET scan on 05/17/2018 revealed focal anal rectal uptake consistent with her history of malignancy.  There was no hypermetabolic lymph nodes in the abdomen or pelvis.  There was no evidence of metastatic disease.  She underwent laparoscopic diverting colostomy on 06/05/2018.  She received 2 cycles of capecitabine + mitomycin (07/02/2018 - 07/30/2018) with concurrent radiation  (07/02/2018 - 08/10/2018).  She underwent colostomy reversal on 02/11/2019.  She enrolled on clinical trial [id number] available through the [facility]:  A randomized phase 2 study of nivolumab after combined modality therapy in high risk anal cancer.  She received 4 cycles of nivolumab (09/19/2018 - 12/12/2018).    Chest, abdomen and pelvis CT on 03/18/2019 revealed no evidence of disease recurrence.  PET scan on 09/23/2019 revealed no evidence of recurrent or metastatic disease.  There were hypermetabolic right/supraclavicular lymph nodes c/w COVID-19 vaccination on 09/20/2019.  There was mild nonspecific hypermetabolism (SUV 3.2) within a  6 mm subcarinal lymph node.  PET scan on 03/09/2020 revealed no hypermetabolic metastatic disease. The previously described hypermetabolic right axillary lymph nodes had resolved.  There was no hypermetabolic anal recurrence.  There was a small colostomy site ventral left abdominal hernia containing small bowel loops without acute bowel complication.  Colonoscopy on 04/03/2020 revealed diverticulosis in the sigmoid colon.   Patient received her last COVID-19 vaccine on 09/20/2019.    Symptomatically, she feels "good." She is still  having discomfort at the site of her colostomy scar.  Exam is stable.  Plan: 1.   Labs today: CBC with diff, CMP. 2.   Stage III anal squamous cell carcinoma  She is s/p diverting colostomy and 2 cycles of capecitabine + mitomycin with concurrent radiation.  She is s/p colostomy reversal.  She enrolled on clinical  trial [id number] and is s/p 4 cycles of nivolumab.   She has withdrawn from the trial secondary to follow-up needed in [place].  PET scan on 09/23/2019 revealed no evidence of recurrent or metastatic disease.    PET scan on 03/09/2020 revealed no evidence of recurrent disease.   PET scan cannot substitute for endoscopy/colonoscopy.  Colonoscopy on 04/03/2020 revealed no evidence of recurrent disease.  Review follow-up endoscopy schedule per NCCN guidelines.     Continue to monitor patient per NCCN guidelines:   DRE every 3-6 months x 5 years.  Anoscopy 6-12 months x 3 years.  Chest, abdomen and pelvis CT annually x 3 years. 3.   RN:  PT consult near [facility]. 4.   Chest, abdomen and pelvis CT on 09/22/2020. 5.   RTC after imaging (phone or video) for review of imaging. 6.   RTC in 6 months for MD assessment and labs (CBC with diff, CMP).  I discussed the assessment and treatment plan with the patient.  The patient was provided an opportunity to ask questions and all were answered.  The patient agreed with the plan and demonstrated an understanding of the instructions.  The patient was advised to call back if the symptoms worsen or if the condition fails to improve as anticipated.  I provided 19 minutes of face-to-face time during this this encounter and > 50% was spent counseling as documented under my assessment and plan.  An additional 7 minutes were spent reviewing her chart (Epic and Care Everywhere) including notes, labs, and imaging studies.    Cylee Dattilo C. Mike Gip, MD, PhD    09/16/2020, 11:21 AM  I, Mirian Mo Tufford, am acting as a Education administrator for Lequita Asal, MD.  I, [place] Mike Gip, MD, have reviewed the above documentation for accuracy and completeness, and I agree with the above.

## 2020-09-16 ENCOUNTER — Inpatient Hospital Stay (HOSPITAL_BASED_OUTPATIENT_CLINIC_OR_DEPARTMENT_OTHER): Payer: Medicare Other | Admitting: Hematology and Oncology

## 2020-09-16 ENCOUNTER — Encounter: Payer: Self-pay | Admitting: Hematology and Oncology

## 2020-09-16 ENCOUNTER — Inpatient Hospital Stay: Payer: Medicare Other | Attending: Hematology and Oncology

## 2020-09-16 ENCOUNTER — Other Ambulatory Visit: Payer: Self-pay

## 2020-09-16 VITALS — BP 106/93 | HR 77 | Temp 96.7°F | Resp 16 | Wt 161.9 lb

## 2020-09-16 DIAGNOSIS — Z87891 Personal history of nicotine dependence: Secondary | ICD-10-CM | POA: Insufficient documentation

## 2020-09-16 DIAGNOSIS — C211 Malignant neoplasm of anal canal: Secondary | ICD-10-CM | POA: Insufficient documentation

## 2020-09-16 DIAGNOSIS — Z9221 Personal history of antineoplastic chemotherapy: Secondary | ICD-10-CM | POA: Insufficient documentation

## 2020-09-16 LAB — COMPREHENSIVE METABOLIC PANEL
ALT: 22 U/L (ref 0–44)
AST: 23 U/L (ref 15–41)
Albumin: 4.4 g/dL (ref 3.5–5.0)
Alkaline Phosphatase: 63 U/L (ref 38–126)
Anion gap: 9 (ref 5–15)
BUN: 17 mg/dL (ref 8–23)
CO2: 27 mmol/L (ref 22–32)
Calcium: 9.6 mg/dL (ref 8.9–10.3)
Chloride: 107 mmol/L (ref 98–111)
Creatinine, Ser: 0.64 mg/dL (ref 0.44–1.00)
GFR, Estimated: >60 mL/min (ref >60)
Glucose, Bld: 98 mg/dL (ref 70–99)
Potassium: 4.2 mmol/L (ref 3.5–5.1)
Sodium: 143 mmol/L (ref 135–145)
Total Bilirubin: 0.4 mg/dL (ref 0.3–1.2)
Total Protein: 7.4 g/dL (ref 6.5–8.1)

## 2020-09-16 LAB — CBC WITH DIFFERENTIAL/PLATELET
Abs Immature Granulocytes: 0.02 10*3/uL (ref 0.00–0.07)
Basophils Absolute: 0.1 10*3/uL (ref 0.0–0.1)
Basophils Relative: 1 %
Eosinophils Absolute: 0.3 10*3/uL (ref 0.0–0.5)
Eosinophils Relative: 3 %
HCT: 41.4 % (ref 36.0–46.0)
Hemoglobin: 14.3 g/dL (ref 12.0–15.0)
Immature Granulocytes: 0 %
Lymphocytes Relative: 26 %
Lymphs Abs: 1.9 10*3/uL (ref 0.7–4.0)
MCH: 32.5 pg (ref 26.0–34.0)
MCHC: 34.5 g/dL (ref 30.0–36.0)
MCV: 94.1 fL (ref 80.0–100.0)
Monocytes Absolute: 0.9 10*3/uL (ref 0.1–1.0)
Monocytes Relative: 12 %
Neutro Abs: 4.3 10*3/uL (ref 1.7–7.7)
Neutrophils Relative %: 58 %
Platelets: 258 10*3/uL (ref 150–400)
RBC: 4.4 MIL/uL (ref 3.87–5.11)
RDW: 12.1 % (ref 11.5–15.5)
WBC: 7.4 10*3/uL (ref 4.0–10.5)
nRBC: 0 % (ref 0.0–0.2)

## 2020-09-16 NOTE — Progress Notes (Signed)
Pt having concerns about sex and the use of vaginal cream, she is not having sex for a while, she wants to talk about PT for anal cancer with stlll problems.

## 2020-10-13 ENCOUNTER — Ambulatory Visit (HOSPITAL_COMMUNITY)
Admission: RE | Admit: 2020-10-13 | Discharge: 2020-10-13 | Disposition: A | Discharge status: Home / Self Care | Payer: Medicare Other | Source: Ambulatory Visit | Attending: Hematology and Oncology | Admitting: Hematology and Oncology

## 2020-10-13 DIAGNOSIS — C211 Malignant neoplasm of anal canal: Secondary | ICD-10-CM | POA: Insufficient documentation

## 2020-10-13 MED ORDER — IOHEXOL 300 MG/ML  SOLN
100.0000 mL | Freq: Once | INTRAMUSCULAR | Status: AC | PRN
  Administered 2020-10-13: 100 mL via INTRAVENOUS

## 2020-10-14 NOTE — Progress Notes (Signed)
[facility]  [address], [address] [place], [place] [address] Phone: [phone]  Fax: [phone]   Telemedicine Office Visit:  10/15/2020  Referring physician: Asencion Noble, MD  I connected with Courtney Gallegos on 10/15/2020 at 12:43 PM by videoconferencing and verified that I was speaking with the correct person using 2 identifiers.  The patient was at home.  I discussed the limitations, risk, security and privacy concerns of performing an evaluation and management service by videoconferencing and the availability of in person appointments.  I also discussed with the patient that there may be a patient responsible charge related to this service.  The patient expressed understanding and agreed to proceed.   Chief Complaint: Courtney Gallegos is a 65 y.o. female with stage III anal squamous cell carcinoma who is seen for review of interval CT scans.  HPI: The patient was last seen in the medical oncology clinic on 09/16/2020. At that time, she felt "good." She was still having discomfort at the site of her colostomy scar.  Exam was stable. Hematocrit was 41.4, hemoglobin 14.3, platelets 258,000, WBC 7,400. CMP was normal. She was referred to PT.  Chest, abdomen, and pelvis CT on 10/13/2020 revealed a stable exam with postsurgical changes of colostomy and reanastomosis. There was no evidence of recurrence or metastatic disease within the chest, abdomen, or pelvis. There was aortic atherosclerosis.  During the interim, she has been "good." She never got a phone call about a PT referral. She is still having urinary incontinence.  Her last colonoscopy was in 04/03/2020 by Dr. Allen Norris. She does not have a follow-up appointment scheduled yet.   Past Medical History:  Diagnosis Date  . Anal cancer ([facility]) 06/07/2019  . Hearing deficit, bilateral    Mild, has hearing aids, does not wear.  . Hyperlipidemia 06/07/2019    Past Surgical History:  Procedure Laterality Date  .  COLONOSCOPY WITH PROPOFOL N/A 04/03/2020   Procedure: COLONOSCOPY WITH PROPOFOL;  Surgeon: [name], MD;  Location: [facility];  Service: Endoscopy;  Laterality: N/A;  Priority 4  . OSTOMY Left 06/07/2019    Family History  Problem Relation Age of Onset  . ALS Mother   . Cancer Father     Social History:  reports that she quit smoking about 10 years ago. She quit after 2.00 years of use. She has never used smokeless tobacco. She reports previous alcohol use of about 2.0 - 4.0 standard drinks of alcohol per week. She reports that she does not use drugs. She is currently retired.  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 She was an ex-smoker, who quit long time ago when she was a teenager. She denies any exposure to radiation outside of treatment. She has no exposure to toxins. She drinks a couple times a week.  Patient used to live in [place], [place]. She lives in [place], [place]. Her daughter and grandchildren liver in [place], [place].  The patient is accompanied by her husband today.  Participants in the patient's visit and their role in the encounter included the patient and Duwayne Heck, CMA today.  The intake visit was provided by Duwayne Heck, [place].  Allergies: No Known Allergies  Current Medications: Current Outpatient Medications  Medication Sig Dispense Refill  . calcium carbonate (OS-CAL - DOSED IN MG OF ELEMENTAL CALCIUM) 1250 (500 Ca) MG tablet Take 1 tablet by mouth.     . ergocalciferol (VITAMIN D2) 1.25 MG (50000 UT) capsule Take 50,000  Units by mouth once a week.     . estradiol (ESTRACE) 0.1 MG/GM vaginal cream Use .05 gm in vagina at bedtime  2-3 x weekly 42.5 g 1  . Multiple Vitamin (MULTIVITAMIN WITH MINERALS) TABS tablet Take 1 tablet by mouth daily.     . psyllium (METAMUCIL) 58.6 % packet Take 1 packet by mouth daily.    . rosuvastatin (CRESTOR) 5 MG tablet Take 5 mg by mouth daily.    Marland Kitchen BIOTIN PO Take by mouth.      No current  facility-administered medications for this visit.    Review of Systems  Constitutional: Negative.  Negative for chills, diaphoresis, fever and malaise/fatigue.       Feels "good".  HENT: Negative.  Negative for congestion, ear discharge, ear pain, hearing loss, nosebleeds, sinus pain, sore throat and tinnitus.   Eyes: Negative.  Negative for blurred vision.  Respiratory: Negative for cough, hemoptysis, sputum production and shortness of breath.   Cardiovascular: Negative.  Negative for chest pain, palpitations and leg swelling.  Gastrointestinal: Negative.  Negative for abdominal pain, blood in stool, constipation, diarrhea, heartburn, melena, nausea and vomiting.  Genitourinary: Negative for dysuria, frequency, hematuria and urgency.       Incontinence.  Musculoskeletal: Negative.  Negative for back pain, joint pain, myalgias and neck pain.  Skin: Negative.  Negative for itching and rash.  Neurological: Negative.  Negative for dizziness, tingling, sensory change, weakness and headaches.  Endo/Heme/Allergies: Negative.  Negative for environmental allergies. Does not bruise/bleed easily.  Psychiatric/Behavioral: Negative.  Negative for depression and memory loss. The patient is not nervous/anxious and does not have insomnia.   All other systems reviewed and are negative.  Performance status (ECOG): 0  Vital Signs There were no vitals taken for this visit.  Physical Exam Vitals and nursing note reviewed.  Constitutional:      General: She is not in acute distress.    Appearance: She is not diaphoretic.  Eyes:     General: No scleral icterus.    Conjunctiva/sclera: Conjunctivae normal.  Neurological:     Mental Status: She is alert and oriented to person, place, and time.  Psychiatric:        Behavior: Behavior normal.        Thought Content: Thought content normal.        Judgment: Judgment normal.    Imaging studies: 02/15/2018:  Ultrasound revealed a 2.7 x 3.3 x 3.2 cm  complex perineal mass.  03/25/2018:  MRI of the pelvis revealed a 3.2 cm palpable perineal mass invading the distal one third of the posterior vaginal wall extending to the introitus, the adjacent vaginal rectal perineum and the anterior anus extending in close proximity to the rectal anal junction craniocaudally to the introitus and appeared to extend from the 9:00 to 3 o'clock position.  Differential included anal carcinoma or vaginal carcinoma origin.  There was no adenopathy or metastatic disease. 05/17/2018:  PET scan revealed focal anal rectal uptake consistent with her history of malignancy.  There was no hypermetabolic lymph nodes in the abdomen or pelvis.  There was no evidence of metastatic disease. 12/24/2018:  PET scan revealed no evidence of disease.   01/09/2019:  Fluoroscopy barium enema revealed no rectovaginal fistula.  There was diverticulosis involving the residual sigmoid colon.   03/18/2019:  Chest, abdomen and pelvis CT revealed no evidence of disease recurrence. ss or tenderness of her hands and feet. She had lost some hair and  09/23/2019:  PET scan revealed  no evidence of recurrent or metastatic disease.  There were hypermetabolic right/supraclavicular lymph nodes c/w COVID-19 vaccination on 09/20/2019.  There was mild nonspecific hypermetabolism (SUV 3.2) within a  6 mm normal-sized subcarinal lymph node. 03/09/2020:  PET scan revealed no hypermetabolic metastatic disease. The previously described hypermetabolic right axillary lymph nodes had resolved. There was no hypermetabolic anal recurrence.  There was a small colostomy site ventral left abdominal hernia containing small bowel loops without acute bowel complication. 10/13/2020:  Chest, abdomen, and pelvis CT revealed a stable exam with postsurgical changes of colostomy and reanastomosis. There was no evidence of recurrence or metastatic disease within the chest, abdomen, or pelvis. There was aortic atherosclerosis.   No  visits with results within 3 Day(s) from this visit.  Latest known visit with results is:  Appointment on 09/16/2020  Component Date Value Ref Range Status  . Sodium 09/16/2020 143  135 - 145 mmol/L Final  . Potassium 09/16/2020 4.2  3.5 - 5.1 mmol/L Final  . Chloride 09/16/2020 107  98 - 111 mmol/L Final  . CO2 09/16/2020 27  22 - 32 mmol/L Final  . Glucose, Bld 09/16/2020 98  70 - 99 mg/dL Final   Glucose reference range applies only to samples taken after fasting for at least 8 hours.  . BUN 09/16/2020 17  8 - 23 mg/dL Final  . Creatinine, Ser 09/16/2020 0.64  0.44 - 1.00 mg/dL Final  . Calcium 09/16/2020 9.6  8.9 - 10.3 mg/dL Final  . Total Protein 09/16/2020 7.4  6.5 - 8.1 g/dL Final  . Albumin 09/16/2020 4.4  3.5 - 5.0 g/dL Final  . AST 09/16/2020 23  15 - 41 U/L Final  . ALT 09/16/2020 22  0 - 44 U/L Final  . Alkaline Phosphatase 09/16/2020 63  38 - 126 U/L Final  . Total Bilirubin 09/16/2020 0.4  0.3 - 1.2 mg/dL Final  . GFR, Estimated 09/16/2020 >60  >60 mL/min Final   Comment: (NOTE) Calculated using the CKD-EPI Creatinine Equation (2021)   . Anion gap 09/16/2020 9  5 - 15 Final   Performed at [facility], [address], [place], [place] [address]  . WBC 09/16/2020 7.4  4.0 - 10.5 K/uL Final  . RBC 09/16/2020 4.40  3.87 - 5.11 MIL/uL Final  . Hemoglobin 09/16/2020 14.3  12.0 - 15.0 g/dL Final  . HCT 09/16/2020 41.4  36.0 - 46.0 % Final  . MCV 09/16/2020 94.1  80.0 - 100.0 fL Final  . MCH 09/16/2020 32.5  26.0 - 34.0 pg Final  . MCHC 09/16/2020 34.5  30.0 - 36.0 g/dL Final  . RDW 09/16/2020 12.1  11.5 - 15.5 % Final  . Platelets 09/16/2020 258  150 - 400 K/uL Final  . nRBC 09/16/2020 0.0  0.0 - 0.2 % Final  . Neutrophils Relative % 09/16/2020 58  % Final  . Neutro Abs 09/16/2020 4.3  1.7 - 7.7 K/uL Final  . Lymphocytes Relative 09/16/2020 26  % Final  . Lymphs Abs 09/16/2020 1.9  0.7 - 4.0 K/uL Final  . Monocytes Relative 09/16/2020 12  % Final  .  Monocytes Absolute 09/16/2020 0.9  0.1 - 1.0 K/uL Final  . Eosinophils Relative 09/16/2020 3  % Final  . Eosinophils Absolute 09/16/2020 0.3  0.0 - 0.5 K/uL Final  . Basophils Relative 09/16/2020 1  % Final  . Basophils Absolute 09/16/2020 0.1  0.0 - 0.1 K/uL Final  . Immature Granulocytes 09/16/2020 0  % Final  .  Abs Immature Granulocytes 09/16/2020 0.02  0.00 - 0.07 K/uL Final   Performed at [facility], [address] [place], [place] [address]    Assessment:  Courtney Gallegos is a 65 y.o. female with stage III anal squamous cell carcinoma s/p biopsy on 04/09/2018.  Pathology revealed a moderately differentiated squamous cell carcinoma, P16+.   MRI of the pelvis on 03/25/2018 revealed a 3.2 cm palpable perineal mass invading the distal one third of the posterior vaginal wall extending to the introitus, the adjacent vaginal rectal perineum and the anterior anus extending in close proximity to the rectal anal junction craniocaudally to the introitus and appeared to extend from the 9:00 to 3 o'clock position.   PET scan on 05/17/2018 revealed focal anal rectal uptake consistent with her history of malignancy.  There was no hypermetabolic lymph nodes in the abdomen or pelvis.  There was no evidence of metastatic disease.  She underwent laparoscopic diverting colostomy on 06/05/2018.  She received 2 cycles of capecitabine + mitomycin (07/02/2018 - 07/30/2018) with concurrent radiation  (07/02/2018 - 08/10/2018).  She underwent colostomy reversal on 02/11/2019.  She enrolled on clinical trial EA2165 available through the [facility]:  A randomized phase 2 study of nivolumab after combined modality therapy in high risk anal cancer.  She received 4 cycles of nivolumab (09/19/2018 - 12/12/2018).    Chest, abdomen and pelvis CT on 03/18/2019 revealed no evidence of disease recurrence.  PET scan on 09/23/2019 revealed no evidence of recurrent or metastatic disease.   There were hypermetabolic right/supraclavicular lymph nodes c/w COVID-19 vaccination on 09/20/2019.  There was mild nonspecific hypermetabolism (SUV 3.2) within a  6 mm subcarinal lymph node.  PET scan on 03/09/2020 revealed no hypermetabolic metastatic disease. The previously described hypermetabolic right axillary lymph nodes had resolved.  There was no hypermetabolic anal recurrence.  There was a small colostomy site ventral left abdominal hernia containing small bowel loops without acute bowel complication. Chest, abdomen, and pelvis CT on 10/13/2020 revealed a stable exam with postsurgical changes of colostomy and reanastomosis. There was no evidence of recurrence or metastatic disease within the chest, abdomen, or pelvis. There was aortic atherosclerosis.  Colonoscopy on 04/03/2020 revealed diverticulosis in the sigmoid colon.   Patient received her last COVID-19 vaccine on 09/20/2019.    Symptomatically,  she feels "good." She is still having urinary incontinence.  She has not been seen by physical therapy.  Plan: 1.   Review labs from 09/16/2020. 2.   Stage III anal squamous cell carcinoma  She is s/p diverting colostomy and 2 cycles of capecitabine + mitomycin with concurrent radiation.  She is s/p colostomy reversal.  She enrolled on clinical trial EA2165 and is s/p 4 cycles of nivolumab.   She has withdrawn from the trial secondary to follow-up needed in [place].  PET scan on 09/23/2019 revealed no evidence of recurrent or metastatic disease.    PET scan on 03/09/2020 revealed no evidence of recurrent disease.   PET scan cannot substitute for endoscopy/colonoscopy.  Chest, abdomen, and pelvis CT on 10/13/2020 was personally reviewed.  Agree with radiology findings.   No evidence of recurrent disease.  Colonoscopy on 04/03/2020 revealed no evidence of recurrent disease.  Re-review follow-up endoscopy schedule per NCCN guidelines.     Continue surveillance per NCCN guidelines:   DRE  every 3-6 months x 5 years.  Anoscopy 6-12 months x 3 years.  Chest, abdomen and pelvis CT annually x 3 years.  Encourage  follow-up appointment with Dr Allen Norris.  3.   RN:  PT consult in [place]. 4.   Front desk:  Please include copy of CT scan in check out papers sent to patient. 5.   RTC in 6 months for MD assessment and labs (CBC with diff, CMP).  I discussed the assessment and treatment plan with the patient.  The patient was provided an opportunity to ask questions and all were answered.  The patient agreed with the plan and demonstrated an understanding of the instructions.  The patient was advised to call back if the symptoms worsen or if the condition fails to improve as anticipated.  I provided 15 minutes (12:25 PM - 12:40 PM) of face-to-face video visit time during this this encounter and > 50% was spent counseling as documented under my assessment and plan.  An additional 5-6 minutes were spent reviewing her chart (Epic and Care Everywhere) including notes, labs, and imaging studies. I provided these services from the [facility] office.     Vincy Feliz C. Mike Gip, MD, PhD    10/15/2020, 12:43 PM  I, Evert Kohl, am acting as a Education administrator for Lequita Asal, MD.  I, Lanett Mike Gip, MD, have reviewed the above documentation for accuracy and completeness, and I agree with the above.

## 2020-10-15 ENCOUNTER — Inpatient Hospital Stay: Payer: Medicare Other | Attending: Hematology and Oncology | Admitting: Hematology and Oncology

## 2020-10-15 ENCOUNTER — Encounter: Payer: Self-pay | Admitting: Hematology and Oncology

## 2020-10-15 ENCOUNTER — Other Ambulatory Visit: Payer: Self-pay

## 2020-10-15 DIAGNOSIS — C211 Malignant neoplasm of anal canal: Secondary | ICD-10-CM | POA: Diagnosis not present

## 2020-12-18 ENCOUNTER — Other Ambulatory Visit: Payer: Self-pay

## 2020-12-18 ENCOUNTER — Telehealth: Payer: Self-pay | Admitting: *Deleted

## 2020-12-18 DIAGNOSIS — C211 Malignant neoplasm of anal canal: Secondary | ICD-10-CM

## 2020-12-18 DIAGNOSIS — N952 Postmenopausal atrophic vaginitis: Secondary | ICD-10-CM

## 2020-12-18 DIAGNOSIS — M6289 Other specified disorders of muscle: Secondary | ICD-10-CM

## 2020-12-18 NOTE — Telephone Encounter (Signed)
Patient called stating that Dr Mike Gip was to have referred her to PT and she has not heard from anyone about it, She is requesting to e referred to [place] for services. She also reports that she was not informed of Dr Mike Gip leaving and wants to know who she will be seeing and that she prefers a female provider. I transferred her to [employer] scheduler to discuss this request. Lastly, she states that she was on a clinical trial [place] before moving here and that they requested her last PET scan and CT scan and was told that we could not find these files. It is in th  Epic that she had a PET in October 2021 at [facility] and [place] in April 2022 at [facility]. Please someone send referral to [facility] for Physical therapy and Shirlean Mylar could you send records to [place], there is a request under media dated 11/25/20.  Plan: 1.   Review labs from 09/16/2020. 2.   Stage III anal squamous cell carcinoma             She is s/p diverting colostomy and 2 cycles of capecitabine + mitomycin with concurrent radiation.             She is s/p colostomy reversal.             She enrolled on clinical trial [id number] and is s/p 4 cycles of nivolumab.                         She has withdrawn from the trial secondary to follow-up needed in [place].             PET scan on 09/23/2019 revealed no evidence of recurrent or metastatic disease.               PET scan on 03/09/2020 revealed no evidence of recurrent disease.                         PET scan cannot substitute for endoscopy/colonoscopy.             Chest, abdomen, and pelvis CT on 10/13/2020 was personally reviewed.  Agree with radiology findings.                         No evidence of recurrent disease.             Colonoscopy on 04/03/2020 revealed no evidence of recurrent disease.             Re-review follow-up endoscopy schedule per NCCN guidelines.                  Continue surveillance per NCCN guidelines:                         DRE every 3-6  months x 5 years.  Anoscopy 6-12 months x 3 years.  Chest, abdomen and pelvis CT annually x 3 years.             Encourage follow-up appointment with Dr Allen Norris.        3.   RN:  PT consult in [place]. 4.   Front desk:  Please include copy of CT scan in check out papers sent to patient. 5.   RTC in 6 months for MD assessment and labs (CBC with diff, CMP).   I  discussed the assessment and treatment plan with the patient.  The patient was provided an opportunity to ask questions and all were answered.  The patient agreed with the plan and demonstrated an understanding of the instructions.  The patient was advised to call back if the symptoms worsen or if the condition fails to improve as anticipated.   I provided 15 minutes (12:25 PM - 12:40 PM) of face-to-face video visit time during this this encounter and > 50% was spent counseling as documented under my assessment and plan.  An additional 5-6 minutes were spent reviewing her chart (Epic and Care Everywhere) including notes, labs, and imaging studies. I provided these services from the [facility] office.       Melissa C. Mike Gip, MD, PhD    10/15/2020, 12:43 PM

## 2020-12-18 NOTE — Telephone Encounter (Signed)
Courtney Conn, RN Spoke with patient. She is requesting referral for pelvic PT for incontinence. She is having pelvic floor dysfunction. She has vaginal applicator but unable to get these applicators in to stretch the vagina. She prefers to get started with pelvic pt asap. She only had 2 or 3 sessions in the past when she was in [place].  Referral placed to [facility]. I have also sent a msg to [employer] who does pelvic PT at [facility] to see who the coordinator is for [employer] [facility]. I gave the patient my direct line at [phone] to follow-up on her request.  I apologized for the delays in her care.

## 2020-12-21 NOTE — Telephone Encounter (Signed)
Pelvic PT apt has been arranged.

## 2020-12-21 NOTE — Telephone Encounter (Signed)
Spoke to patient by phone. She is able to use medium sized vaginal dilator. Reports painful intercourse. Symptoms likely related to post menopausal, atrophic tissues and worsened by post radiation changes for treatment of anal cancer. Has started vaginal estrogen per her gyn. May consider increasing to three times a week if improvement in vaginal plasticity not observed. She has appt with pelvic floor pt. We discussed candle exercises. She has follow up scheduled with Dr. Janese Banks. Can se epatient in symptom management clinic in the future if needed.

## 2021-01-13 ENCOUNTER — Other Ambulatory Visit: Payer: Self-pay

## 2021-01-13 ENCOUNTER — Ambulatory Visit (HOSPITAL_COMMUNITY): Payer: Medicare Other | Attending: Oncology | Admitting: Physical Therapy

## 2021-01-13 DIAGNOSIS — N3946 Mixed incontinence: Secondary | ICD-10-CM | POA: Insufficient documentation

## 2021-01-14 NOTE — Therapy (Signed)
[facility] [address] [place], [place], [address] Phone: [phone]   Fax:  [phone]  Physical Therapy Evaluation  Patient Details  Name: Courtney Gallegos MRN: [id number] Date of Birth: Nov 02, 1955 Referring Provider (PT): Delight Hoh   Encounter Date: 01/13/2021   PT End of Session - 01/13/21 1533     Visit Number 1    Number of Visits 4    Date for PT Re-Evaluation 02/12/21    Authorization Type medicare    Progress Note Due on Visit 4    PT Start Time [id number]    PT Stop Time 1530    PT Time Calculation (min) 45 min    Activity Tolerance Patient tolerated treatment well    Behavior During Therapy [name] for tasks assessed/performed             Past Medical History:  Diagnosis Date   Anal cancer ([place]) 06/07/2019   Hearing deficit, bilateral    Mild, has hearing aids, does not wear.   Hyperlipidemia 06/07/2019    Past Surgical History:  Procedure Laterality Date   COLONOSCOPY WITH PROPOFOL N/A 04/03/2020   Procedure: COLONOSCOPY WITH PROPOFOL;  Surgeon: [name], MD;  Location: [facility];  Service: Endoscopy;  Laterality: N/A;  Priority 4   OSTOMY Left 06/07/2019    There were no vitals filed for this visit.    Subjective Assessment - 01/13/21 1500     Subjective [name] states that she wears panty liners and every once a while she has leakage but her main concern is that she had anal cancer and now her sphincter is weak.  If she feels it coming she goes to the bathroom but if she can not get to the bathroom she will have problems.  It has been two years since she stopped radiation and had a reversal of her ostomey she is not getting any better.  She does not go anywhere before noon, she does not go for walks due to her problem.  She has to wait afternoon to do her errands.  If she has to go to store she goes to the bathroom before she goes, when she get there and before she leaves.  No pain.     Pertinent History Stage III anal cancer with radiation and chemo,    Patient Stated Goals less accidents.    Currently in Pain? No/denies                       01/13/21 0001  Exercises  Exercises Lumbar  Lumbar Exercises: Seated  Other Seated Lumbar Exercises pelvic clock x 3  12-6; 3-9; 10-4; 2-8 and all; kegal quick flick 2" hold 4" rest x 10 ; long  Lumbar Exercises: Supine  Heel Slides 5 reps         Objective measurements completed on examination: See above findings.               PT Education - 01/13/21 1532     Education Details HEP    Person(s) Educated Patient    Methods Explanation;Handout    Comprehension Verbalized understanding;Returned demonstration              PT Short Term Goals - 01/13/21 1710       PT SHORT TERM GOAL #1   Title Pt to be I in a HEP in order to be able to be able to make it to the commode before she  is beginning to have a bowel movement.    Time 2    Period Weeks    Status New    Target Date 01/27/21               PT Long Term Goals - 01/14/21 0815       PT LONG TERM GOAL #1   Title PT to be I in an advance HEP in order to strengthen her pelvic floor mm in order to allow pt to hold the urge of having a bowel movement for 5-10 minutes in order to be able find a commode if she is out in the public area.    Time 4    Period Weeks    Status New    Target Date 02/11/21                    Plan - 01/13/21 1702     Clinical Impression Statement [name] is a 65 yo female who was dx with stage III anal cancer.  She had radiation,a colostomy followed by a reversal of the colostomy.  She is now being referred to skilled therapy as her sphincter mm is not strong and she is finding it difficult to keep her fecal continence once she feels the urge to go.  [name] will benefit from skilled PT to strengthen both her abdominal and pelvic floor mm. She was informed that this clinic does not  perform internal exams and if she desires this she can be transferred to our [facility].  She would like to try this program first as it is so close to her home, then, if things are not headed in the direction she would like we will transfer her for a second opinion.    Personal Factors and Comorbidities Time since onset of injury/illness/exacerbation;Comorbidity 1;Comorbidity 2;Fitness    Comorbidities past radiation, past chemo,    Examination-Activity Limitations Hygiene/Grooming    Examination-Participation Restrictions Other;Community Activity;Shop    Stability/Clinical Decision Making Stable/Uncomplicated    Clinical Decision Making Moderate    Rehab Potential Good    PT Frequency 1x / week    PT Duration 4 weeks    PT Treatment/Interventions Patient/family education;Therapeutic exercise    PT Next Visit Plan Begin sidelying abduction, all four single leg lift, abdominal lift and increase kegal as able.             Patient will benefit from skilled therapeutic intervention in order to improve the following deficits and impairments:  Decreased strength  Visit Diagnosis: Mixed incontinence     Problem List Patient Active Problem List   Diagnosis Date Noted   Encounter for gynecological examination with Papanicolaou smear of cervix 11/05/2019   Routine cervical smear 11/05/2019   Vaginal atrophy 11/05/2019   Screening for colorectal cancer 11/05/2019   History of anal cancer 11/05/2019   Cancer of anal canal [facility]) 06/10/2019  Rayetta Humphrey, PT CLT [phone]  01/14/2021, 8:18 AM  [facility] [address] [place], [place], [address] Phone: [phone]   Fax:  [phone]  Name: Courtney Gallegos MRN: [id number] Date of Birth: 19-Jan-1956

## 2021-01-19 ENCOUNTER — Ambulatory Visit (HOSPITAL_COMMUNITY): Payer: Medicare Other | Admitting: Physical Therapy

## 2021-01-19 ENCOUNTER — Encounter (HOSPITAL_COMMUNITY): Payer: Self-pay | Admitting: Physical Therapy

## 2021-01-19 ENCOUNTER — Other Ambulatory Visit: Payer: Self-pay

## 2021-01-19 DIAGNOSIS — N3946 Mixed incontinence: Secondary | ICD-10-CM | POA: Diagnosis not present

## 2021-01-19 NOTE — Therapy (Signed)
[facility] [address] [place], [place], [address] Phone: [phone]   Fax:  [phone]  Physical Therapy Treatment  Patient Details  Name: Courtney Gallegos MRN: [id number] Date of Birth: 1956/06/03 Referring Provider (PT): Delight Hoh   Encounter Date: 01/19/2021   PT End of Session - 01/19/21 1613     Visit Number 2    Number of Visits 4    Date for PT Re-Evaluation 02/12/21    Authorization Type medicare    Progress Note Due on Visit 4    PT Start Time [id number]    PT Stop Time 1610    PT Time Calculation (min) 40 min    Activity Tolerance Patient tolerated treatment well    Behavior During Therapy [facility] for tasks assessed/performed             Past Medical History:  Diagnosis Date   Anal cancer ([place]) 06/07/2019   Hearing deficit, bilateral    Mild, has hearing aids, does not wear.   Hyperlipidemia 06/07/2019    Past Surgical History:  Procedure Laterality Date   COLONOSCOPY WITH PROPOFOL N/A 04/03/2020   Procedure: COLONOSCOPY WITH PROPOFOL;  Surgeon: [name], MD;  Location: [facility];  Service: Endoscopy;  Laterality: N/A;  Priority 4   OSTOMY Left 06/07/2019    There were no vitals filed for this visit.   Subjective Assessment - 01/19/21 1532     Subjective PT is sore from her exercises    Pertinent History Stage III anal cancer with radiation and chemo,    Patient Stated Goals less accidents.                               [facility] Adult PT Treatment/Exercise - 01/19/21 0001       Exercises   Exercises Lumbar      Lumbar Exercises: Seated   Other Seated Lumbar Exercises Kegals 15 seconds rest 30 x 10     Lumbar Exercises: Supine   Heel Slides 15 reps B    Bridge   Abdominal crunch  10 reps;5 seconds  1     Lumbar Exercises: Sidelying   Hip Abduction 10 reps             All 4's  leg extension with transverse abdominal \ Contraction.          PT  Short Term Goals - 01/19/21 1615       PT SHORT TERM GOAL #1   Title Pt to be I in a HEP in order to be able to be able to make it to the commode before she is beginning to have a bowel movement.    Time 2    Period Weeks    Status On-going               PT Long Term Goals - 01/19/21 1615       PT LONG TERM GOAL #1   Title PT to be I in an advance HEP in order to strengthen her pelvic floor mm in order to allow pt to hold the urge of having a bowel movement for 5-10 minutes in order to be able find a commode if she is out in the public area.    Time 4    Period Weeks    Status On-going  Plan - 01/19/21 1613     Clinical Impression Statement Pt sore from exercises but has not noted any improvement.  Therapist progressed pt to sidelying, all four and crunches, increased Kegals to 15 second hold    Personal Factors and Comorbidities Time since onset of injury/illness/exacerbation;Comorbidity 1;Comorbidity 2;Fitness    Comorbidities past radiation, past chemo,    Examination-Activity Limitations Hygiene/Grooming    Examination-Participation Restrictions Other;Community Activity;Shop    Stability/Clinical Decision Making Stable/Uncomplicated    Rehab Potential Good    PT Frequency 1x / week    PT Duration 4 weeks    PT Treatment/Interventions Patient/family education;Therapeutic exercise    PT Next Visit Plan begin tband exercises.             Patient will benefit from skilled therapeutic intervention in order to improve the following deficits and impairments:  Decreased strength  Visit Diagnosis: Mixed incontinence     Problem List Patient Active Problem List   Diagnosis Date Noted   Encounter for gynecological examination with Papanicolaou smear of cervix 11/05/2019   Routine cervical smear 11/05/2019   Vaginal atrophy 11/05/2019   Screening for colorectal cancer 11/05/2019   History of anal cancer 11/05/2019   Cancer of anal canal  [facility]) 06/10/2019   Rayetta Humphrey, PT CLT [phone]  01/19/2021, 4:17 PM  [facility] [address] [place], [place], [address] Phone: [phone]   Fax:  [phone]  Name: Courtney Gallegos MRN: [id number] Date of Birth: 08/18/1955

## 2021-01-28 ENCOUNTER — Ambulatory Visit (HOSPITAL_COMMUNITY): Payer: Medicare Other | Attending: Oncology | Admitting: Physical Therapy

## 2021-01-28 ENCOUNTER — Other Ambulatory Visit: Payer: Self-pay

## 2021-01-28 DIAGNOSIS — N3946 Mixed incontinence: Secondary | ICD-10-CM | POA: Insufficient documentation

## 2021-01-28 NOTE — Therapy (Signed)
[facility] [address] [place], [place], [address] Phone: [phone]   Fax:  [phone]  Physical Therapy Treatment  Patient Details  Name: Courtney Gallegos MRN: [id number] Date of Birth: 1956/02/22 Referring Provider (PT): Delight Hoh   Encounter Date: 01/28/2021   PT End of Session - 01/28/21 1445     Visit Number 3    Number of Visits 4    Date for PT Re-Evaluation 02/12/21    Authorization Type medicare    Progress Note Due on Visit 4    PT Start Time [id number]    PT Stop Time 1523    PT Time Calculation (min) 38 min    Activity Tolerance Patient tolerated treatment well    Behavior During Therapy [facility] for tasks assessed/performed             Past Medical History:  Diagnosis Date   Anal cancer ([place]) 06/07/2019   Hearing deficit, bilateral    Mild, has hearing aids, does not wear.   Hyperlipidemia 06/07/2019    Past Surgical History:  Procedure Laterality Date   COLONOSCOPY WITH PROPOFOL N/A 04/03/2020   Procedure: COLONOSCOPY WITH PROPOFOL;  Surgeon: [name], MD;  Location: [facility];  Service: Endoscopy;  Laterality: N/A;  Priority 4   OSTOMY Left 06/07/2019    There were no vitals filed for this visit.   Subjective Assessment - 01/28/21 1443     Subjective PT has been completing her exercises.  She still does not see much change in her ability to control her bowel.    Pertinent History Stage III anal cancer with radiation and chemo,    Patient Stated Goals less accidents.    Currently in Pain? No/denies                       [facility] Adult PT Treatment/Exercise - 01/28/21 0001       Exercises   Exercises Lumbar      Lumbar Exercises: Standing   Other Standing Lumbar Exercises Tband putt putt, flexion, single diagonal pull down and B pull down all x 10 reps.      Lumbar Exercises: Seated   Other Seated Lumbar Exercises Kegals 20 seconds rest 30    Other Seated Lumbar Exercises  abdominal crunch x20      Lumbar Exercises: Supine   Heel Slides 15 reps    Bridge 5 reps;2 seconds   with marching.     Lumbar Exercises: Sidelying   Hip Abduction 15 reps      Lumbar Exercises: Quadruped   Straight Leg Raise --    Opposite Arm/Leg Raise Left arm/Right leg;Right arm/Left leg;10 reps                      PT Short Term Goals - 01/19/21 1615       PT SHORT TERM GOAL #1   Title Pt to be I in a HEP in order to be able to be able to make it to the commode before she is beginning to have a bowel movement.    Time 2    Period Weeks    Status On-going               PT Long Term Goals - 01/19/21 1615       PT LONG TERM GOAL #1   Title PT to be I in an advance HEP in order to strengthen her pelvic floor  mm in order to allow pt to hold the urge of having a bowel movement for 5-10 minutes in order to be able find a commode if she is out in the public area.    Time 4    Period Weeks    Status On-going                   Plan - 01/28/21 1532     Clinical Impression Statement Added T band exercises, progressed to 20 second kegal hold and opposite arm/leg raise.    Personal Factors and Comorbidities Time since onset of injury/illness/exacerbation;Comorbidity 1;Comorbidity 2;Fitness    Comorbidities past radiation, past chemo,    Examination-Activity Limitations Hygiene/Grooming    Examination-Participation Restrictions Other;Community Activity;Shop    Stability/Clinical Decision Making Stable/Uncomplicated    Clinical Decision Making Moderate    Rehab Potential Good    PT Frequency 1x / week    PT Treatment/Interventions Patient/family education;Therapeutic exercise    PT Next Visit Plan Review all exercises and discharge.    PT Home Exercise Plan kegal, heelslides, sidelying abduction, all four opposite arm/leg raise, bridge with marching, Tband exercises             Patient will benefit from skilled therapeutic intervention in  order to improve the following deficits and impairments:  Decreased strength  Visit Diagnosis: Mixed incontinence     Problem List Patient Active Problem List   Diagnosis Date Noted   Encounter for gynecological examination with Papanicolaou smear of cervix 11/05/2019   Routine cervical smear 11/05/2019   Vaginal atrophy 11/05/2019   Screening for colorectal cancer 11/05/2019   History of anal cancer 11/05/2019   Cancer of anal canal [facility]) 06/10/2019  Rayetta Humphrey, PT CLT [phone]  01/28/2021, 3:34 PM  [facility] [address] [place], [place], [address] Phone: [phone]   Fax:  [phone]  Name: Courtney Gallegos MRN: [id number] Date of Birth: 13-Dec-1955

## 2021-02-05 ENCOUNTER — Telehealth (HOSPITAL_COMMUNITY): Payer: Self-pay | Admitting: Physical Therapy

## 2021-02-05 ENCOUNTER — Encounter (HOSPITAL_COMMUNITY): Payer: Medicare Other | Admitting: Physical Therapy

## 2021-02-05 NOTE — Telephone Encounter (Signed)
!  st not show:  Called pt re missed appointment.    Rayetta Humphrey, [place] CLT [phone]

## 2021-02-11 ENCOUNTER — Ambulatory Visit (HOSPITAL_COMMUNITY): Payer: Medicare Other | Admitting: Physical Therapy

## 2021-02-11 ENCOUNTER — Encounter (HOSPITAL_COMMUNITY): Payer: Self-pay | Admitting: Physical Therapy

## 2021-02-11 ENCOUNTER — Other Ambulatory Visit: Payer: Self-pay

## 2021-02-11 DIAGNOSIS — N3946 Mixed incontinence: Secondary | ICD-10-CM

## 2021-02-11 NOTE — Therapy (Signed)
[facility] [place], [place], [address] Phone: [phone]   Fax:  [phone]  Physical Therapy Treatment  Patient Details  Name: Courtney Gallegos MRN: [id number] Date of Birth: 04/22/1956 Referring Provider (PT): Delight Hoh  PHYSICAL THERAPY DISCHARGE SUMMARY  Visits from Start of Care: 4  Current functional level related to goals / functional outcomes: Pt is I in kegal and abdominal strengthening exercises.    Remaining deficits: Fecal incontinence    Education / Equipment: HEP    Patient agrees to discharge. Patient goals were not met. Patient is being discharged due to lack of progress. 4 Encounter Date: 02/11/2021   PT End of Session - 02/11/21 1501     Visit Number 4    Number of Visits 4    Date for PT Re-Evaluation 02/12/21    Authorization Type medicare    Progress Note Due on Visit 4    PT Start Time [id number]    PT Stop Time 1522    PT Time Calculation (min) 29 min    Activity Tolerance Patient tolerated treatment well    Behavior During Therapy [facility] for tasks assessed/performed             Past Medical History:  Diagnosis Date   Anal cancer ([place]) 06/07/2019   Hearing deficit, bilateral    Mild, has hearing aids, does not wear.   Hyperlipidemia 06/07/2019    Past Surgical History:  Procedure Laterality Date   COLONOSCOPY WITH PROPOFOL N/A 04/03/2020   Procedure: COLONOSCOPY WITH PROPOFOL;  Surgeon: [name], MD;  Location: [facility];  Service: Endoscopy;  Laterality: N/A;  Priority 4   OSTOMY Left 06/07/2019    There were no vitals filed for this visit.   Subjective Assessment - 02/11/21 1500     Subjective PT has been doing all her exercises except the t band due to her tband splitting.    Pertinent History Stage III anal cancer with radiation and chemo,    Patient Stated Goals less accidents.                [facility] PT Assessment - 02/11/21 0001       Assessment    Medical Diagnosis Bowel incontinence    Referring Provider (PT) Delight Hoh    Onset Date/Surgical Date 05/28/19    Prior Therapy none      Precautions   Precautions None      Restrictions   Weight Bearing Restrictions No      Cognition   Overall Cognitive Status Within Functional Limits for tasks assessed                           [facility] Adult PT Treatment/Exercise - 02/11/21 0001       Exercises   Exercises Lumbar      Lumbar Exercises: Standing   Other Standing Lumbar Exercises Palof 10 rep both Rt and LT tandem stance x 10                    PT Education - 02/11/21 1524     Education Details Therapist gave pt 2 week pass to [facility] and urged pt to start walking again; answered all questions.  Reminded pt that she might want to consider taking a few treatments at the [facility] clinic as the pelvic therapist there focus soley on pelvic issues and may have information to assist patient in her  goals    Person(s) Educated Patient    Comprehension Verbalized understanding              PT Short Term Goals - 02/11/21 1502       PT SHORT TERM GOAL #1   Title Pt to be I in a HEP in order to be able to be able to make it to the commode before she is beginning to have a bowel movement.    Time 2    Period Weeks    Status Not Met    Target Date 01/27/21               PT Long Term Goals - 02/11/21 1502       PT LONG TERM GOAL #1   Title PT to be I in an advance HEP in order to strengthen her pelvic floor mm in order to allow pt to hold the urge of having a bowel movement for 5-10 minutes in order to be able find a commode if she is out in the public area.    Period Weeks    Status Not Met                   Plan - 02/11/21 1527     Clinical Impression Statement Therapist gave pt green theraband as this is slightly thidker and will hopefully not rip.  .Added palof exercises to pt exercises, urged pt to begin a walking  program.  Pt reluctent to walk due to fears of fecal incontinence.  Therapist gave pt 2wk pass for [employer].    Personal Factors and Comorbidities Time since onset of injury/illness/exacerbation;Comorbidity 1;Comorbidity 2;Fitness    Comorbidities past radiation, past chemo,    Examination-Activity Limitations Hygiene/Grooming    Examination-Participation Restrictions Other;Community Activity;Shop    Stability/Clinical Decision Making Stable/Uncomplicated    Rehab Potential Good    PT Frequency 1x / week    PT Treatment/Interventions Patient/family education;Therapeutic exercise    PT Next Visit Plan Review all exercises and discharge.    PT Home Exercise Plan kegal, heelslides, sidelying abduction, all four opposite arm/leg raise, bridge with marching, Tband exercises             Patient will benefit from skilled therapeutic intervention in order to improve the following deficits and impairments:  Decreased strength  Visit Diagnosis: Mixed incontinence     Problem List Patient Active Problem List   Diagnosis Date Noted   Encounter for gynecological examination with Papanicolaou smear of cervix 11/05/2019   Routine cervical smear 11/05/2019   Vaginal atrophy 11/05/2019   Screening for colorectal cancer 11/05/2019   History of anal cancer 11/05/2019   Cancer of anal canal [facility]) 06/10/2019   Rayetta Humphrey, PT CLT [phone]  02/11/2021, 3:30 PM  [facility] [address] [place], [place], [address] Phone: [phone]   Fax:  [phone]  Name: Courtney Gallegos MRN: [id number] Date of Birth: [birth year]

## 2021-02-18 ENCOUNTER — Encounter (HOSPITAL_COMMUNITY): Payer: Medicare Other | Admitting: Physical Therapy

## 2021-02-18 NOTE — Addendum Note (Signed)
Addended by: Leeroy Cha on: 02/18/2021 04:02 PM   Modules accepted: Orders

## 2021-03-22 ENCOUNTER — Other Ambulatory Visit: Payer: Medicare Other

## 2021-03-22 ENCOUNTER — Ambulatory Visit: Payer: Medicare Other | Admitting: Hematology and Oncology

## 2021-03-30 ENCOUNTER — Other Ambulatory Visit (HOSPITAL_COMMUNITY): Payer: Self-pay | Admitting: Internal Medicine

## 2021-03-30 DIAGNOSIS — Z1231 Encounter for screening mammogram for malignant neoplasm of breast: Secondary | ICD-10-CM

## 2021-04-05 IMAGING — MG DIGITAL SCREENING BREAST BILAT IMPLANT W/ TOMO W/ CAD
9 of 16 series · 9 of 40 positions shown · non-contrast
Comparison: None.

CLINICAL DATA: Screening.

EXAM:
DIGITAL SCREENING BILATERAL MAMMOGRAM WITH IMPLANTS, CAD AND TOMO
The patient has retropectoral implants. Standard and implant
displaced views were performed.

[R MLO (1 of 3)]
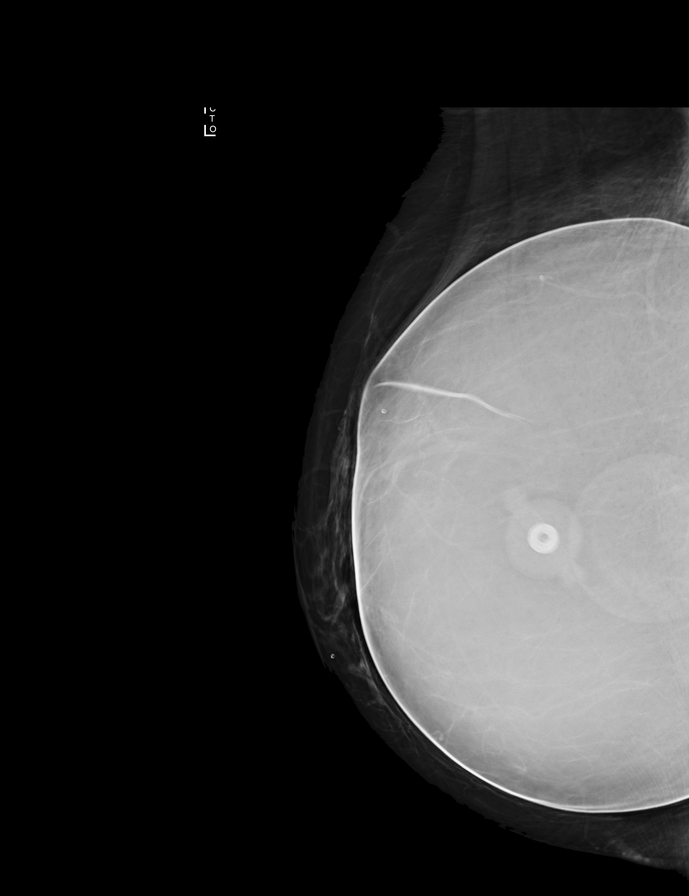

[L MLO (1 of 2)]
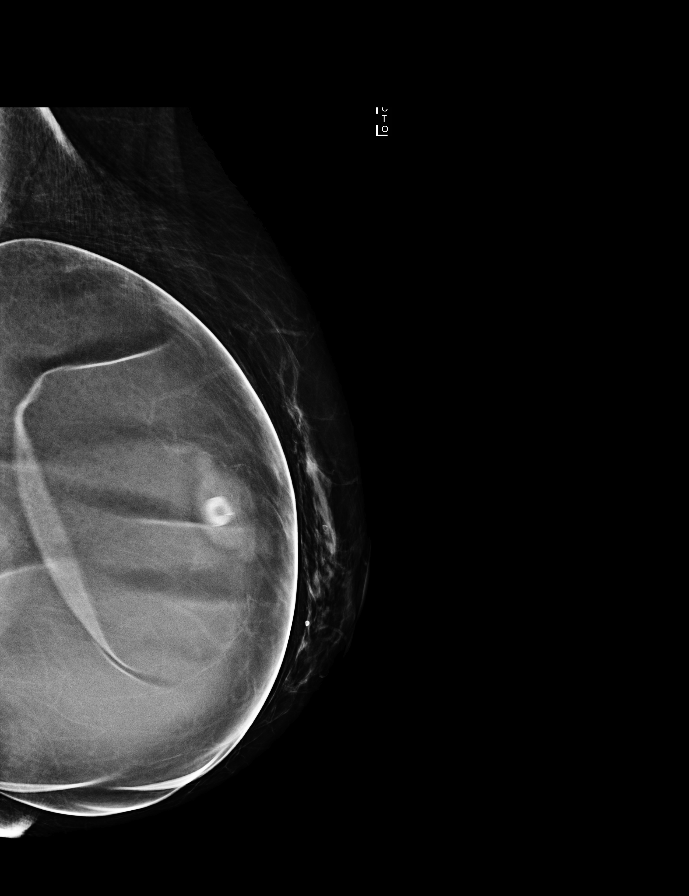

[L CC (1 of 2)]
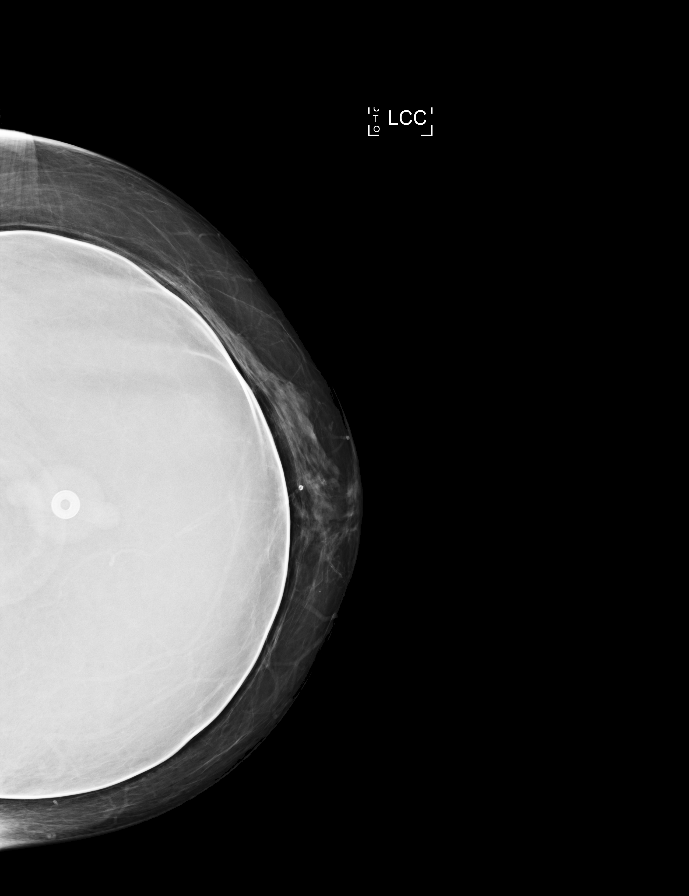

[R CC (1 of 2)]
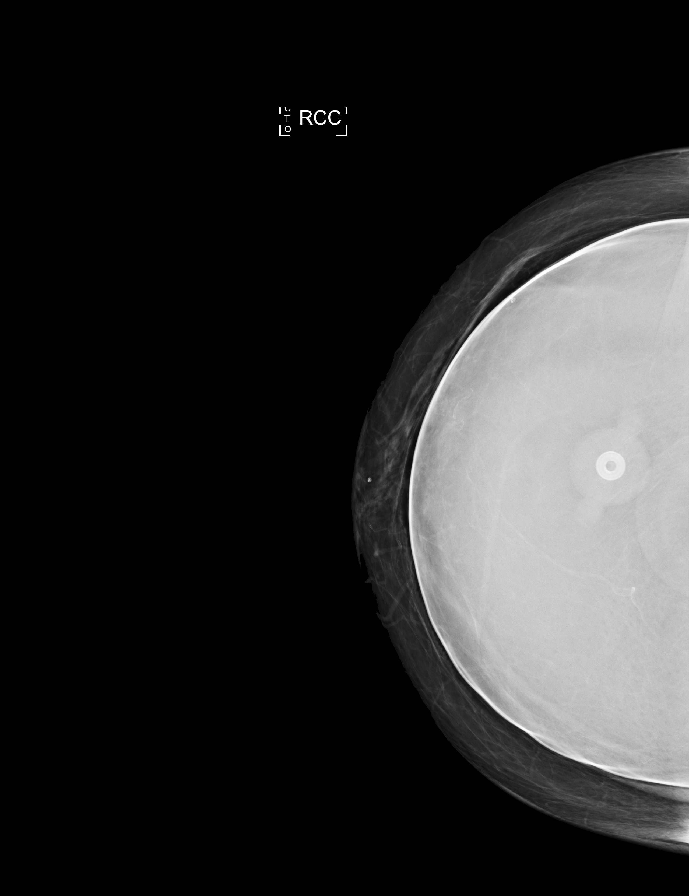

[R CC (2 of 2)]
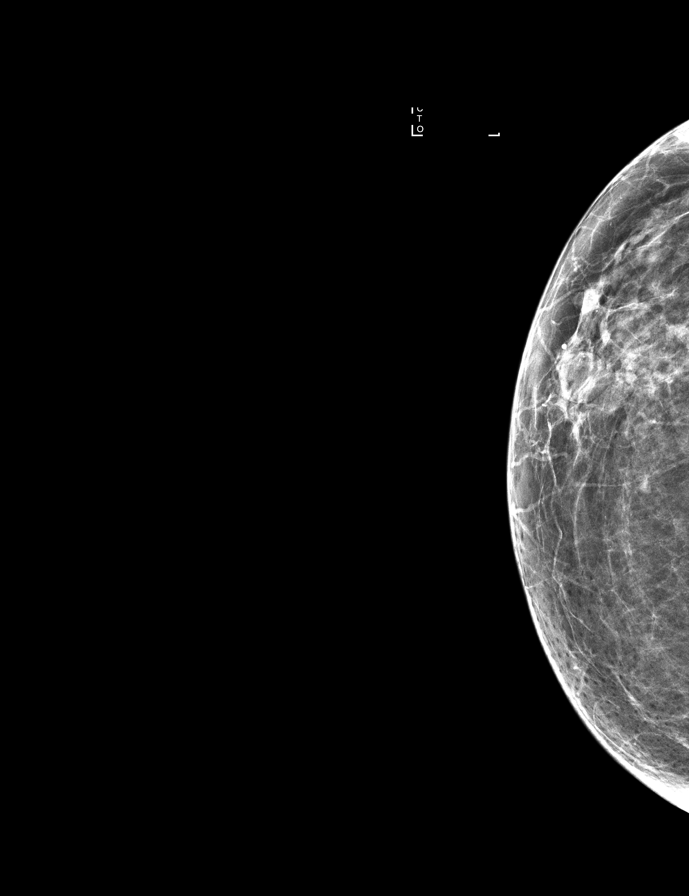

[L MLO (2 of 2)]
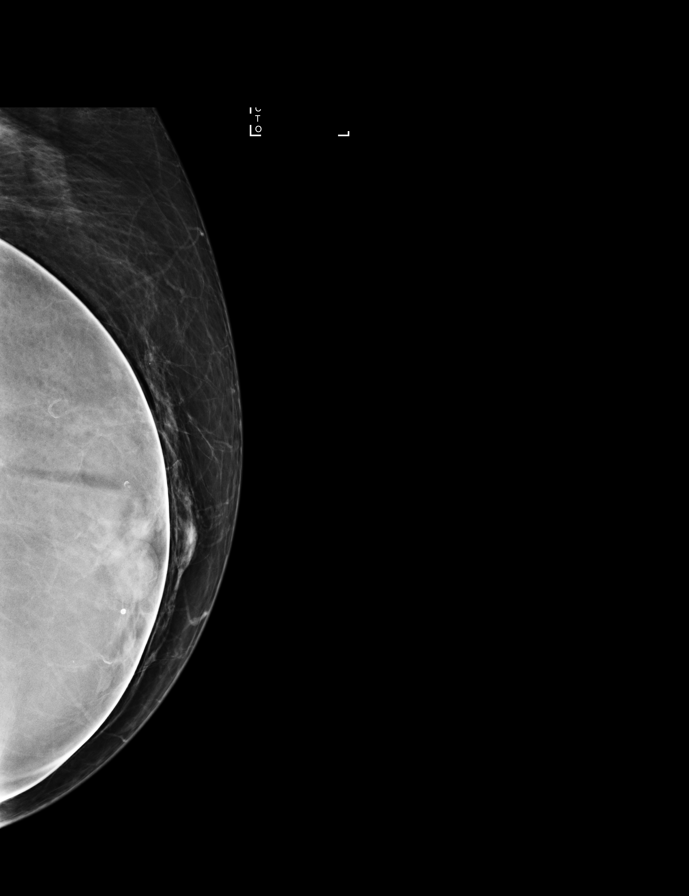

[R MLO (2 of 3)]
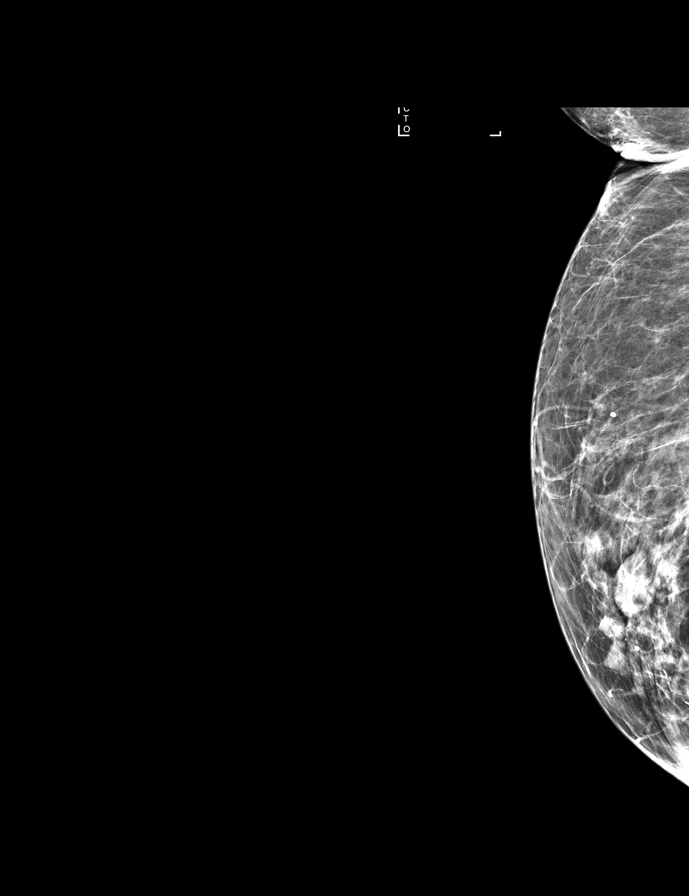

[R MLO (3 of 3)]
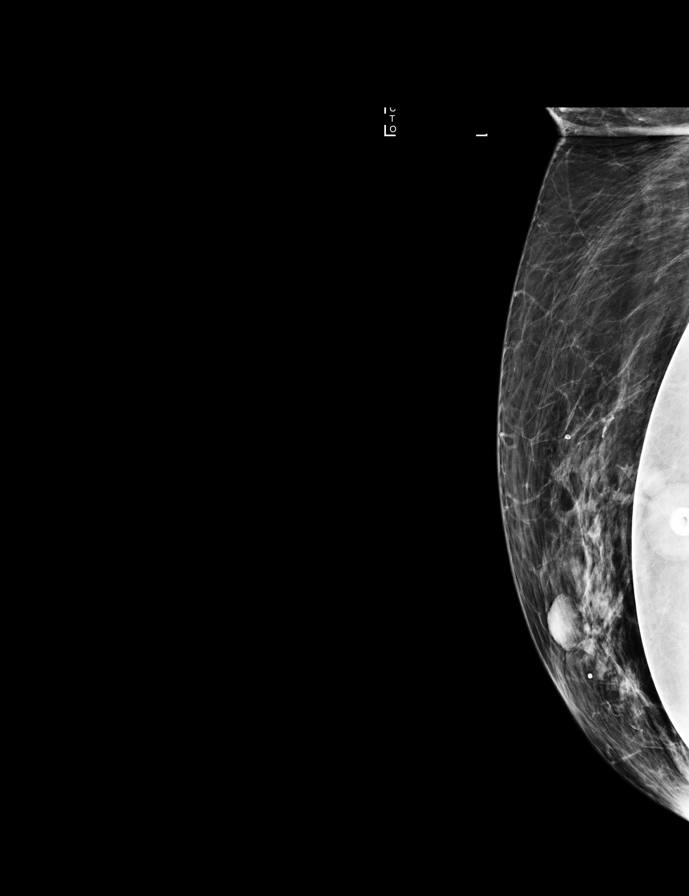

[L CC (2 of 2)]
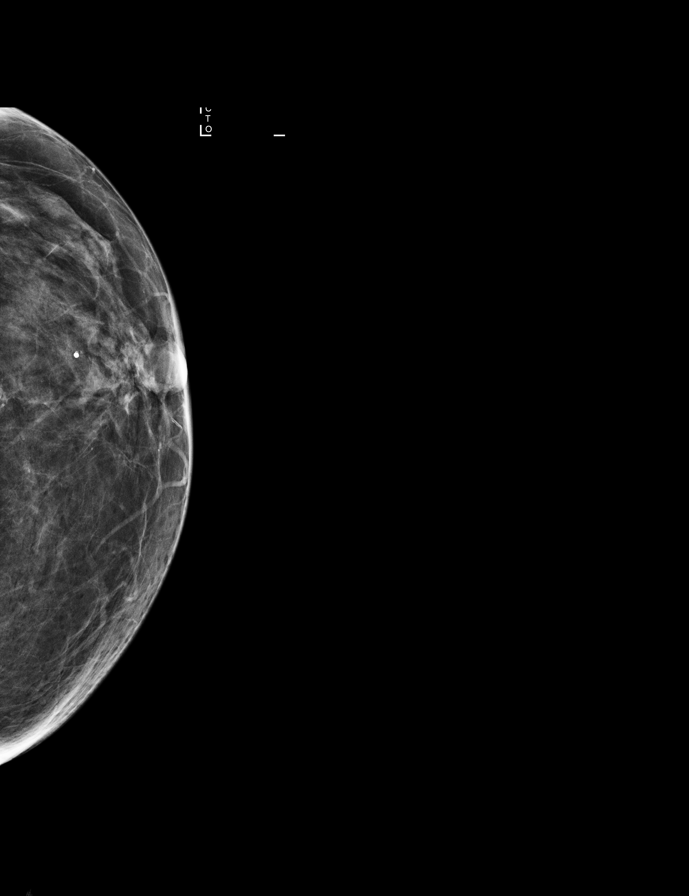

[9 of 40 positions shown; findings below may reference images not displayed]

ACR Breast Density Category b: There are scattered areas of
fibroglandular density.
FINDINGS: There are no findings suspicious for malignancy. Images were
processed with CAD.
IMPRESSION: No mammographic evidence of malignancy. A result letter of this
screening mammogram will be mailed directly to the patient.

RECOMMENDATION:
Screening mammogram in one year. (Code:[id number])

BI-RADS CATEGORY  1:  Negative.

## 2021-04-07 ENCOUNTER — Ambulatory Visit (HOSPITAL_COMMUNITY)
Admission: RE | Admit: 2021-04-07 | Discharge: 2021-04-07 | Disposition: A | Discharge status: Home / Self Care | Payer: Medicare Other | Source: Ambulatory Visit | Attending: Internal Medicine | Admitting: Internal Medicine

## 2021-04-07 ENCOUNTER — Encounter (HOSPITAL_COMMUNITY): Payer: Self-pay

## 2021-04-07 ENCOUNTER — Other Ambulatory Visit: Payer: Self-pay

## 2021-04-07 DIAGNOSIS — Z1231 Encounter for screening mammogram for malignant neoplasm of breast: Secondary | ICD-10-CM | POA: Diagnosis not present

## 2021-04-13 ENCOUNTER — Other Ambulatory Visit (HOSPITAL_COMMUNITY): Payer: Self-pay | Admitting: Internal Medicine

## 2021-04-13 DIAGNOSIS — R928 Other abnormal and inconclusive findings on diagnostic imaging of breast: Secondary | ICD-10-CM

## 2021-04-19 ENCOUNTER — Other Ambulatory Visit: Payer: Medicare Other

## 2021-04-19 ENCOUNTER — Ambulatory Visit: Payer: Medicare Other

## 2021-04-20 ENCOUNTER — Other Ambulatory Visit: Payer: Medicare Other

## 2021-04-20 ENCOUNTER — Ambulatory Visit
Admission: RE | Admit: 2021-04-20 | Discharge: 2021-04-20 | Disposition: A | Discharge status: Home / Self Care | Payer: Medicare Other | Source: Ambulatory Visit | Attending: Oncology | Admitting: Oncology

## 2021-04-20 ENCOUNTER — Other Ambulatory Visit: Payer: Self-pay

## 2021-04-20 ENCOUNTER — Ambulatory Visit
Admission: RE | Admit: 2021-04-20 | Discharge: 2021-04-20 | Disposition: A | Discharge status: Home / Self Care | Payer: Medicare Other | Attending: Oncology | Admitting: Oncology

## 2021-04-20 ENCOUNTER — Inpatient Hospital Stay: Payer: Medicare Other | Attending: Oncology | Admitting: Oncology

## 2021-04-20 ENCOUNTER — Inpatient Hospital Stay: Payer: Medicare Other

## 2021-04-20 VITALS — BP 119/77 | HR 76 | Temp 97.8°F | Resp 18 | Wt 166.0 lb

## 2021-04-20 DIAGNOSIS — M25552 Pain in left hip: Secondary | ICD-10-CM | POA: Insufficient documentation

## 2021-04-20 DIAGNOSIS — C211 Malignant neoplasm of anal canal: Secondary | ICD-10-CM | POA: Diagnosis not present

## 2021-04-20 LAB — CBC WITH DIFFERENTIAL/PLATELET
Abs Immature Granulocytes: 0.04 10*3/uL (ref 0.00–0.07)
Basophils Absolute: 0.1 10*3/uL (ref 0.0–0.1)
Basophils Relative: 1 %
Eosinophils Absolute: 0.2 10*3/uL (ref 0.0–0.5)
Eosinophils Relative: 3 %
HCT: 39 % (ref 36.0–46.0)
Hemoglobin: 13.1 g/dL (ref 12.0–15.0)
Immature Granulocytes: 1 %
Lymphocytes Relative: 25 %
Lymphs Abs: 2 10*3/uL (ref 0.7–4.0)
MCH: 32.5 pg (ref 26.0–34.0)
MCHC: 33.6 g/dL (ref 30.0–36.0)
MCV: 96.8 fL (ref 80.0–100.0)
Monocytes Absolute: 0.8 10*3/uL (ref 0.1–1.0)
Monocytes Relative: 11 %
Neutro Abs: 4.8 10*3/uL (ref 1.7–7.7)
Neutrophils Relative %: 59 %
Platelets: 233 10*3/uL (ref 150–400)
RBC: 4.03 MIL/uL (ref 3.87–5.11)
RDW: 12.6 % (ref 11.5–15.5)
WBC: 7.9 10*3/uL (ref 4.0–10.5)
nRBC: 0 % (ref 0.0–0.2)

## 2021-04-20 LAB — COMPREHENSIVE METABOLIC PANEL
ALT: 23 U/L (ref 0–44)
AST: 19 U/L (ref 15–41)
Albumin: 4.3 g/dL (ref 3.5–5.0)
Alkaline Phosphatase: 55 U/L (ref 38–126)
Anion gap: 7 (ref 5–15)
BUN: 26 mg/dL — ABNORMAL HIGH (ref 8–23)
CO2: 26 mmol/L (ref 22–32)
Calcium: 9 mg/dL (ref 8.9–10.3)
Chloride: 105 mmol/L (ref 98–111)
Creatinine, Ser: 0.61 mg/dL (ref 0.44–1.00)
GFR, Estimated: >60 mL/min (ref >60)
Glucose, Bld: 96 mg/dL (ref 70–99)
Potassium: 4.3 mmol/L (ref 3.5–5.1)
Sodium: 138 mmol/L (ref 135–145)
Total Bilirubin: 0.4 mg/dL (ref 0.3–1.2)
Total Protein: 7.2 g/dL (ref 6.5–8.1)

## 2021-04-20 NOTE — Progress Notes (Signed)
Patient states that her 6 month PET scan didn't get scheduled and she reports that her left hip has been bothering her

## 2021-04-20 NOTE — Progress Notes (Signed)
[facility]  [address] [place], [place] [address] Phone: [phone]  Fax: [phone]   Telemedicine Office Visit:  04/20/2021  Referring physician: Asencion Noble, MD  Chief Complaint: Courtney Gallegos is a 65 y.o. female with stage III anal squamous cell carcinoma who presents for routine follow-up.  HPI: Courtney Gallegos is a 66 year old female limited past medical history who was previously followed by Dr. Mike Gip for anal squamous cell carcinoma.  Her most recent CT scan was from 10/13/2020 which was stable with no evidence of recurrence or metastatic disease.  Her last colonoscopy was on 04/03/2020 with Dr. Allen Norris.   In the interim, she has done well.  Patient suffers from bowel incontinence and has been seen by physical therapy last in August 2022.  She was also instructed to perform pelvic floor exercises to help with urge bowel incontinence.  She reports therapy was more directed at her bladder incontinence than her bowel.  States she still has bowel urgency and has to plan her outings based on public bathroom access.  She has to wear depends for long car drives d/t fear of an incontinent episode.  She is frustrated with the transition from previous oncologist in [place] to [place].  She is worried that her scans are not being completed every 6 months as they should.  She states she is still part of a clinical trial and scans must be completed to remain in the trial.  States she is due for a PET scan and digital rectal exam.  She has not been seen by Dr. Allen Norris for colonoscopy in over a year.  She reports left hip pain that started about a week ago.  Denies injury.  Reports wearing a new pair of shoes with a heel and standing for long periods of time.  Denies previous history of hip pain.  She has not tried anything for the pain because she was nervous to.   Past Medical History:  Diagnosis Date   Anal cancer ([place]) 06/07/2019   Hearing deficit, bilateral     Mild, has hearing aids, does not wear.   Hyperlipidemia 06/07/2019    Past Surgical History:  Procedure Laterality Date   BREAST ENHANCEMENT SURGERY Bilateral    COLONOSCOPY WITH PROPOFOL N/A 04/03/2020   Procedure: COLONOSCOPY WITH PROPOFOL;  Surgeon: [name], MD;  Location: [place];  Service: Endoscopy;  Laterality: N/A;  Priority 4   OSTOMY Left 06/07/2019    Family History  Problem Relation Age of Onset   ALS Mother    Cancer Father     Social History:  reports that she quit smoking about 10 years ago. She has never used smokeless tobacco. She reports that she does not currently use alcohol after a past usage of about 2.0 - 4.0 standard drinks per week. She reports that she does not use drugs. She is currently retired.  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 She was an ex-smoker, who quit long time ago when she was a teenager. She denies any exposure to radiation outside of treatment. She has no exposure to toxins. She drinks a couple times a week.  Patient used to live in [place], [place]. She lives in [place], [place]. Her daughter and grandchildren liver in [place], [place].  The patient is accompanied by her husband today.  Allergies: No Known Allergies  Current Medications: Current Outpatient Medications  Medication Sig Dispense Refill   BIOTIN PO Take by mouth.  calcium carbonate (OS-CAL - DOSED IN MG OF ELEMENTAL CALCIUM) 1250 (500 Ca) MG tablet Take 1 tablet by mouth.      ergocalciferol (VITAMIN D2) 1.25 MG (50000 UT) capsule Take 50,000 Units by mouth once a week.      estradiol (ESTRACE) 0.1 MG/GM vaginal cream Use .05 gm in vagina at bedtime  2-3 x weekly 42.5 g 1   Multiple Vitamin (MULTIVITAMIN WITH MINERALS) TABS tablet Take 1 tablet by mouth daily.      psyllium (METAMUCIL) 58.6 % packet Take 1 packet by mouth daily.     rosuvastatin (CRESTOR) 5 MG tablet Take 5 mg by mouth daily.     No current facility-administered medications  for this visit.    Review of Systems  Constitutional: Negative.  Negative for chills, fever, malaise/fatigue and weight loss.  HENT:  Negative for congestion, ear pain and tinnitus.   Eyes: Negative.  Negative for blurred vision and double vision.  Respiratory: Negative.  Negative for cough, sputum production and shortness of breath.   Cardiovascular: Negative.  Negative for chest pain, palpitations and leg swelling.  Gastrointestinal: Negative.  Negative for abdominal pain, constipation, diarrhea, nausea and vomiting.       Bowel incontinence  Genitourinary:  Negative for dysuria, frequency and urgency.  Musculoskeletal:  Positive for joint pain (Left Hip). Negative for back pain and falls.  Skin: Negative.  Negative for rash.  Neurological: Negative.  Negative for weakness and headaches.  Endo/Heme/Allergies: Negative.  Does not bruise/bleed easily.  Psychiatric/Behavioral: Negative.  Negative for depression. The patient is not nervous/anxious and does not have insomnia.   Performance status (ECOG): 0  Vital Signs There were no vitals taken for this visit.  Physical Exam Exam conducted with a chaperone present.  Constitutional:      Appearance: She is well-developed.  HENT:     Head: Normocephalic and atraumatic.  Eyes:     Pupils: Pupils are equal, round, and reactive to light.  Cardiovascular:     Rate and Rhythm: Normal rate and regular rhythm.     Heart sounds: No murmur heard. Pulmonary:     Effort: Pulmonary effort is normal.     Breath sounds: Normal breath sounds. No wheezing.  Abdominal:     General: Bowel sounds are normal. There is no distension.     Palpations: Abdomen is soft. There is no mass.     Tenderness: There is no abdominal tenderness.  Genitourinary:   Musculoskeletal:        General: Normal range of motion.     Cervical back: Normal range of motion.  Skin:    General: Skin is warm and dry.  Neurological:     Mental Status: She is alert and  oriented to person, place, and time.  Psychiatric:        Behavior: Behavior normal.   Imaging studies: 02/15/2018:  Ultrasound revealed a 2.7 x 3.3 x 3.2 cm complex perineal mass.  03/25/2018:  MRI of the pelvis revealed a 3.2 cm palpable perineal mass invading the distal one third of the posterior vaginal wall extending to the introitus, the adjacent vaginal rectal perineum and the anterior anus extending in close proximity to the rectal anal junction craniocaudally to the introitus and appeared to extend from the 9:00 to 3 o'clock position.  Differential included anal carcinoma or vaginal carcinoma origin.  There was no adenopathy or metastatic disease. 05/17/2018:  PET scan revealed focal anal rectal uptake consistent with her history of malignancy.  There was no hypermetabolic lymph nodes in the abdomen or pelvis.  There was no evidence of metastatic disease. 12/24/2018:  PET scan revealed no evidence of disease.   01/09/2019:  Fluoroscopy barium enema revealed no rectovaginal fistula.  There was diverticulosis involving the residual sigmoid colon.   03/18/2019:  Chest, abdomen and pelvis CT revealed no evidence of disease recurrence. ss or tenderness of her hands and feet. She had lost some hair and  09/23/2019:  PET scan revealed no evidence of recurrent or metastatic disease.  There were hypermetabolic right/supraclavicular lymph nodes c/w COVID-19 vaccination on 09/20/2019.  There was mild nonspecific hypermetabolism (SUV 3.2) within a  6 mm normal-sized subcarinal lymph node. 03/09/2020:  PET scan revealed no hypermetabolic metastatic disease. The previously described hypermetabolic right axillary lymph nodes had resolved. There was no hypermetabolic anal recurrence.  There was a small colostomy site ventral left abdominal hernia containing small bowel loops without acute bowel complication. 10/13/2020:  Chest, abdomen, and pelvis CT revealed a stable exam with postsurgical changes of  colostomy and reanastomosis. There was no evidence of recurrence or metastatic disease within the chest, abdomen, or pelvis. There was aortic atherosclerosis.   Appointment on 04/20/2021  Component Date Value Ref Range Status   WBC 04/20/2021 7.9  4.0 - 10.5 K/uL Final   RBC 04/20/2021 4.03  3.87 - 5.11 MIL/uL Final   Hemoglobin 04/20/2021 13.1  12.0 - 15.0 g/dL Final   HCT 04/20/2021 39.0  36.0 - 46.0 % Final   MCV 04/20/2021 96.8  80.0 - 100.0 fL Final   MCH 04/20/2021 32.5  26.0 - 34.0 pg Final   MCHC 04/20/2021 33.6  30.0 - 36.0 g/dL Final   RDW 04/20/2021 12.6  11.5 - 15.5 % Final   Platelets 04/20/2021 233  150 - 400 K/uL Final   nRBC 04/20/2021 0.0  0.0 - 0.2 % Final   Neutrophils Relative % 04/20/2021 59  % Final   Neutro Abs 04/20/2021 4.8  1.7 - 7.7 K/uL Final   Lymphocytes Relative 04/20/2021 25  % Final   Lymphs Abs 04/20/2021 2.0  0.7 - 4.0 K/uL Final   Monocytes Relative 04/20/2021 11  % Final   Monocytes Absolute 04/20/2021 0.8  0.1 - 1.0 K/uL Final   Eosinophils Relative 04/20/2021 3  % Final   Eosinophils Absolute 04/20/2021 0.2  0.0 - 0.5 K/uL Final   Basophils Relative 04/20/2021 1  % Final   Basophils Absolute 04/20/2021 0.1  0.0 - 0.1 K/uL Final   Immature Granulocytes 04/20/2021 1  % Final   Abs Immature Granulocytes 04/20/2021 0.04  0.00 - 0.07 K/uL Final   Performed at [facility], [address] [place], [place] [address]    Assessment: Courtney Gallegos reports for routine 72-month follow-up for stage III anal cancer.  Plan: Stage III anal squamous cell carcinoma-status post diverting colostomy and 2 cycles of capecitabine plus mitomycin with concurrent radiation.  She is status post colostomy reversal.  She was enrolled in a clinical trial and status post 4 cycles of nivolumab.  She has withdrawn from clinical trial due to need for follow-up in [place].  Most recent imaging is from 10/13/2020 which did not reveal any evidence of recurrent or metastatic  disease.  She is due for repeat PET scan ASAP.  She also needs follow-up with GI for colonoscopy.  NCCN guidelines recommend DRE every 3 to 6 months x 5 years, colonoscopy 6 to 12 months x 3 years and CT chest abdomen  pelvis annually x3 years.  Patient needs follow-up with Dr. Allen Norris ASAP.  I have asked that she reach out to Dr. Allen Norris to get a colonoscopy scheduled.  Patient states she is still part of the clinical trial but Dr. Kem Parkinson note states that she withdrew from the clinical trial due to proximity of appointments.   Left Hip Pain-we will get x-ray of left hip to rule out acute abnormality.  We will call patient with results.  Left hip x-ray was negative for acute abnormality.  Disposition-PET scan in 1 week.  Patient would prefer [place].  She is scheduled for PET scan on 04/22/2021 and I will call her on Friday, 04/23/2021 with results.  I spent 15 minutes dedicated to the care of this patient (face-to-face and non-face-to-face) on the date of the encounter to include what is described in the assessment and plan.  Faythe Casa, NP 04/20/2021 1:16 PM

## 2021-04-22 ENCOUNTER — Other Ambulatory Visit: Payer: Self-pay

## 2021-04-22 ENCOUNTER — Encounter (HOSPITAL_COMMUNITY)
Admission: RE | Admit: 2021-04-22 | Discharge: 2021-04-22 | Disposition: A | Discharge status: Home / Self Care | Payer: Medicare Other | Source: Ambulatory Visit | Attending: Oncology | Admitting: Oncology

## 2021-04-22 ENCOUNTER — Encounter (HOSPITAL_COMMUNITY): Payer: Self-pay

## 2021-04-22 DIAGNOSIS — C211 Malignant neoplasm of anal canal: Secondary | ICD-10-CM | POA: Diagnosis present

## 2021-04-22 MED ORDER — FLUDEOXYGLUCOSE F - 18 (FDG) INJECTION
9.6000 | Freq: Once | INTRAVENOUS | Status: AC | PRN
  Administered 2021-04-22: 8.723 via INTRAVENOUS

## 2021-04-23 ENCOUNTER — Inpatient Hospital Stay (HOSPITAL_BASED_OUTPATIENT_CLINIC_OR_DEPARTMENT_OTHER): Payer: Medicare Other | Admitting: Oncology

## 2021-04-23 DIAGNOSIS — C211 Malignant neoplasm of anal canal: Secondary | ICD-10-CM

## 2021-04-23 DIAGNOSIS — M25552 Pain in left hip: Secondary | ICD-10-CM

## 2021-04-23 NOTE — Progress Notes (Signed)
Virtual Visit via Telephone Note  I connected with Courtney Gallegos on 04/23/21 at  2:20 PM EDT by telephone and verified that I am speaking with the correct person using two identifiers.  Location: Patient: Home Provider: Clinic    I discussed the limitations, risks, security and privacy concerns of performing an evaluation and management service by telephone and the availability of in person appointments. I also discussed with the patient that there may be a patient responsible charge related to this service. The patient expressed understanding and agreed to proceed.   History of Present Illness: Courtney Gallegos is a 65 year old female with past medical history significant for anal squamous cell carcinoma followed by Dr. Mike Gip who was recently seen in clinic for routine follow-up.  Most recent imaging was from 10/13/2020 which did not reveal any evidence of recurrence or metastatic disease.  She had a colonoscopy last in October 2021 with Dr. Allen Norris which was negative.  She is due for repeat imaging.  In the interim, she continues to have bowel and bladder incontinence.  She has been seen by physical therapy without significant improvement of her symptoms.  States she knows where almost all the bathrooms are located in [place] due to the severity of her incontinence.  She wears depends for long car rides.  Denies any pain.  Denies any bleeding.  Additionally she was having left hip pain prompting an x-ray which was negative for acute abnormality.  Patient believes it was due to new shoes.   Patient had PET scan completed yesterday and reports back virtually to discuss results.  Observations/Objective:Review of Systems  Constitutional: Negative.  Negative for chills, fever, malaise/fatigue and weight loss.  HENT:  Negative for congestion, ear pain and tinnitus.   Eyes: Negative.  Negative for blurred vision and double vision.  Respiratory: Negative.  Negative for cough, sputum production and  shortness of breath.   Cardiovascular: Negative.  Negative for chest pain, palpitations and leg swelling.  Gastrointestinal: Negative.  Negative for abdominal pain, constipation, diarrhea, nausea and vomiting.  Genitourinary:  Negative for dysuria, frequency and urgency.  Musculoskeletal:  Positive for joint pain (Left hip). Negative for back pain and falls.  Skin: Negative.  Negative for rash.  Neurological: Negative.  Negative for weakness and headaches.  Endo/Heme/Allergies: Negative.  Does not bruise/bleed easily.  Psychiatric/Behavioral: Negative.  Negative for depression. The patient is not nervous/anxious and does not have insomnia.    Physical Exam Neurological:     Mental Status: She is alert and oriented to person, place, and time.   Assessment and Plan:  We discussed that her x-ray of her left hip was negative with no acute injury or fracture.  Her PET scan was not ready to review and I explained I would give her a call once it resulted.    PET scan from 04/22/2021 was negative for recurrent anal cancer or metastatic disease.  We will reach out to Dr. Allen Norris to see if patient needs additional colonoscopy at this time.  Follow Up Instructions: RTC as scheduled.     I discussed the assessment and treatment plan with the patient. The patient was provided an opportunity to ask questions and all were answered. The patient agreed with the plan and demonstrated an understanding of the instructions.   The patient was advised to call back or seek an in-person evaluation if the symptoms worsen or if the condition fails to improve as anticipated.  I provided 15 minutes of non-face-to-face time during this encounter.  Jacquelin Hawking, NP

## 2021-05-13 ENCOUNTER — Other Ambulatory Visit (HOSPITAL_COMMUNITY): Payer: Self-pay | Admitting: Internal Medicine

## 2021-05-13 ENCOUNTER — Ambulatory Visit (HOSPITAL_COMMUNITY)
Admission: RE | Admit: 2021-05-13 | Discharge: 2021-05-13 | Disposition: A | Discharge status: Home / Self Care | Payer: Medicare Other | Source: Ambulatory Visit | Attending: Internal Medicine | Admitting: Internal Medicine

## 2021-05-13 ENCOUNTER — Other Ambulatory Visit: Payer: Self-pay

## 2021-05-13 DIAGNOSIS — R928 Other abnormal and inconclusive findings on diagnostic imaging of breast: Secondary | ICD-10-CM | POA: Insufficient documentation

## 2021-05-18 ENCOUNTER — Encounter (HOSPITAL_COMMUNITY): Payer: Medicare Other

## 2021-05-18 ENCOUNTER — Ambulatory Visit (HOSPITAL_COMMUNITY): Payer: Medicare Other

## 2021-08-23 ENCOUNTER — Telehealth: Payer: Self-pay | Admitting: Adult Health

## 2021-08-23 ENCOUNTER — Telehealth: Payer: Self-pay

## 2021-08-23 NOTE — Telephone Encounter (Signed)
Left message @ 12:29 pm. JSY

## 2021-08-23 NOTE — Telephone Encounter (Signed)
Returned pt's call. Two identifiers used. Pt stated that she had a pretty significant yeast infection and had used the 1 day Monistat treatment. Her symptoms improved, but came back after 3 days. She was advised to take a 7 day treatment and let us know if she was still experiencing any symptoms after that. Pt confirmed understanding.

## 2021-08-23 NOTE — Telephone Encounter (Signed)
Patient would like a nurse to call her.

## 2021-08-23 NOTE — Telephone Encounter (Signed)
Pt called and stated that she was returning a call from Mrs. Laurann Montana and she wanted a nurse to call her.

## 2021-08-24 NOTE — Telephone Encounter (Addendum)
Glenard Haring, RN spoke with pt yesterday. Pap not due until 2024. She sees PCP for yearly physical. Closing this encounter. [place]

## 2021-10-04 ENCOUNTER — Ambulatory Visit
Admission: RE | Admit: 2021-10-04 | Discharge: 2021-10-04 | Disposition: A | Discharge status: Home / Self Care | Payer: Medicare Other | Source: Ambulatory Visit | Attending: Oncology | Admitting: Oncology

## 2021-10-04 DIAGNOSIS — C211 Malignant neoplasm of anal canal: Secondary | ICD-10-CM | POA: Insufficient documentation

## 2021-10-04 LAB — POCT I-STAT CREATININE: Creatinine, Ser: 0.6 mg/dL (ref 0.44–1.00)

## 2021-10-04 MED ORDER — IOHEXOL 300 MG/ML  SOLN
100.0000 mL | Freq: Once | INTRAMUSCULAR | Status: AC | PRN
  Administered 2021-10-04: 100 mL via INTRAVENOUS

## 2021-10-19 ENCOUNTER — Inpatient Hospital Stay (HOSPITAL_BASED_OUTPATIENT_CLINIC_OR_DEPARTMENT_OTHER): Payer: Medicare Other | Admitting: Oncology

## 2021-10-19 ENCOUNTER — Inpatient Hospital Stay: Payer: Medicare Other | Attending: Oncology

## 2021-10-19 ENCOUNTER — Encounter: Payer: Self-pay | Admitting: Oncology

## 2021-10-19 ENCOUNTER — Telehealth: Payer: Self-pay | Admitting: Oncology

## 2021-10-19 VITALS — BP 109/81 | HR 75 | Temp 97.3°F | Resp 16 | Ht 67.72 in | Wt 150.7 lb

## 2021-10-19 DIAGNOSIS — Z79899 Other long term (current) drug therapy: Secondary | ICD-10-CM | POA: Insufficient documentation

## 2021-10-19 DIAGNOSIS — Z08 Encounter for follow-up examination after completed treatment for malignant neoplasm: Secondary | ICD-10-CM

## 2021-10-19 DIAGNOSIS — Z85048 Personal history of other malignant neoplasm of rectum, rectosigmoid junction, and anus: Secondary | ICD-10-CM

## 2021-10-19 DIAGNOSIS — Z87891 Personal history of nicotine dependence: Secondary | ICD-10-CM | POA: Insufficient documentation

## 2021-10-19 DIAGNOSIS — C211 Malignant neoplasm of anal canal: Secondary | ICD-10-CM

## 2021-10-19 LAB — COMPREHENSIVE METABOLIC PANEL
ALT: 22 U/L (ref 0–44)
AST: 26 U/L (ref 15–41)
Albumin: 4.5 g/dL (ref 3.5–5.0)
Alkaline Phosphatase: 46 U/L (ref 38–126)
Anion gap: 9 (ref 5–15)
BUN: 14 mg/dL (ref 8–23)
CO2: 25 mmol/L (ref 22–32)
Calcium: 9.2 mg/dL (ref 8.9–10.3)
Chloride: 103 mmol/L (ref 98–111)
Creatinine, Ser: 0.69 mg/dL (ref 0.44–1.00)
GFR, Estimated: >60 mL/min (ref >60)
Glucose, Bld: 104 mg/dL — ABNORMAL HIGH (ref 70–99)
Potassium: 4.3 mmol/L (ref 3.5–5.1)
Sodium: 137 mmol/L (ref 135–145)
Total Bilirubin: 0.6 mg/dL (ref 0.3–1.2)
Total Protein: 7.4 g/dL (ref 6.5–8.1)

## 2021-10-19 LAB — CBC WITH DIFFERENTIAL/PLATELET
Abs Immature Granulocytes: 0.02 10*3/uL (ref 0.00–0.07)
Basophils Absolute: 0 10*3/uL (ref 0.0–0.1)
Basophils Relative: 1 %
Eosinophils Absolute: 0.1 10*3/uL (ref 0.0–0.5)
Eosinophils Relative: 3 %
HCT: 43.2 % (ref 36.0–46.0)
Hemoglobin: 14.5 g/dL (ref 12.0–15.0)
Immature Granulocytes: 0 %
Lymphocytes Relative: 31 %
Lymphs Abs: 1.5 10*3/uL (ref 0.7–4.0)
MCH: 32.3 pg (ref 26.0–34.0)
MCHC: 33.6 g/dL (ref 30.0–36.0)
MCV: 96.2 fL (ref 80.0–100.0)
Monocytes Absolute: 0.5 10*3/uL (ref 0.1–1.0)
Monocytes Relative: 11 %
Neutro Abs: 2.5 10*3/uL (ref 1.7–7.7)
Neutrophils Relative %: 54 %
Platelets: 216 10*3/uL (ref 150–400)
RBC: 4.49 MIL/uL (ref 3.87–5.11)
RDW: 12.9 % (ref 11.5–15.5)
WBC: 4.7 10*3/uL (ref 4.0–10.5)
nRBC: 0 % (ref 0.0–0.2)

## 2021-10-19 NOTE — Progress Notes (Signed)
? ? ? ?Hematology/Oncology Consult note ?[place]  ?[phone] [phone] ? ?Patient Care Team: ?Asencion Noble, MD as PCP - General (Internal Medicine) ?[name], MD as Consulting Physician (Gastroenterology) ?Sindy Guadeloupe, MD as Consulting Physician (Hematology and Oncology)  ? ?Name of the patient: Courtney Gallegos  ?[id number]  ?1955-12-23  ? ?Date of visit: 10/19/21 ? ?Diagnosis-history of anal cancer in 2019 ? ?Chief complaint/ Reason for visit-routine surveillance visit for anal cancer ?Marland Kitchen  ?Heme/Onc history: Patient is a 66 year old female who waS/p biopsy in October 2019.  Pathology showed moderately differentiated squamous cell carcinoma p16 positive.s diagnosed withStage III anal squamous cell carcinoma. MRI of the pelvis on 03/25/2018 revealed a 3.2 cm palpable perineal mass invading the distal one third of the posterior vaginal wall extending to the introitus, the adjacent vaginal rectal perineum and the anterior anus extending in close proximity to the rectal anal junction craniocaudally to the introitus and appeared to extend from the 9:00 to 3 o'clock position.   PET scan on 05/17/2018 revealed focal anal rectal uptake consistent with her history of malignancy.  There was no hypermetabolic lymph nodes in the abdomen or pelvis.  There was no evidence of metastatic disease. ?  ?She underwent laparoscopic diverting colostomy on 06/05/2018.  She received 2 cycles of capecitabine + mitomycin (07/02/2018 - 07/30/2018) with concurrent radiation  (07/02/2018 - 08/10/2018).  She underwent colostomy reversal on 02/11/2019. ?  ?She enrolled on clinical trial [id number] available through the [facility]:  A randomized phase 2 study of nivolumab after combined modality therapy in high risk anal cancer.  She received 4 cycles of nivolumab (09/19/2018 - 12/12/2018).   ?  ?Patient has been in remission since then ? ?Interval history-patient presently reports  doing well and denies any specific complaints at this time.  Appetite and weight have remained stable.  Bowel habits are regular.  Denies any blood in stool ? ?ECOG PS- 0 ?Pain scale- 0 ? ?Review of systems- Review of Systems  ?Constitutional:  Negative for chills, fever, malaise/fatigue and weight loss.  ?HENT:  Negative for congestion, ear discharge and nosebleeds.   ?Eyes:  Negative for blurred vision.  ?Respiratory:  Negative for cough, hemoptysis, sputum production, shortness of breath and wheezing.   ?Cardiovascular:  Negative for chest pain, palpitations, orthopnea and claudication.  ?Gastrointestinal:  Negative for abdominal pain, blood in stool, constipation, diarrhea, heartburn, melena, nausea and vomiting.  ?Genitourinary:  Negative for dysuria, flank pain, frequency, hematuria and urgency.  ?Musculoskeletal:  Negative for back pain, joint pain and myalgias.  ?Skin:  Negative for rash.  ?Neurological:  Negative for dizziness, tingling, focal weakness, seizures, weakness and headaches.  ?Endo/Heme/Allergies:  Does not bruise/bleed easily.  ?Psychiatric/Behavioral:  Negative for depression and suicidal ideas. The patient does not have insomnia.    ? ? ? ?No Known Allergies ? ? ?Past Medical History:  ?Diagnosis Date  ? Anal cancer ([place]) 06/07/2019  ? Hearing deficit, bilateral   ? Mild, has hearing aids, does not wear.  ? Hyperlipidemia 06/07/2019  ? ? ? ?Past Surgical History:  ?Procedure Laterality Date  ? BREAST ENHANCEMENT SURGERY Bilateral   ? COLONOSCOPY WITH PROPOFOL N/A 04/03/2020  ? Procedure: COLONOSCOPY WITH PROPOFOL;  Surgeon: [name], MD;  Location: [place];  Service: Endoscopy;  Laterality: N/A;  Priority 4  ? OSTOMY Left 06/07/2019  ? ? ?Social History  ? ?Socioeconomic History  ? Marital status: Significant Other  ?  Spouse name:  Not on file  ? Number of children: 3  ? Years of education: Not on file  ? Highest education level: Not on file  ?Occupational History  ?  Occupation: Retired  ?Tobacco Use  ? Smoking status: Former  ?  Years: 2.00  ?  Types: Cigarettes  ?  Quit date: 2012  ?  Years since quitting: 11.3  ? Smokeless tobacco: Never  ? Tobacco comments:  ?  Wine with dinner  ?Vaping Use  ? Vaping Use: Never used  ?Substance and Sexual Activity  ? Alcohol use: Yes  ?  Alcohol/week: 2.0 - 4.0 standard drinks  ?  Types: 1 - 2 Glasses of wine, 1 - 2 Shots of liquor per week  ? Drug use: Never  ? Sexual activity: Not Currently  ?  Birth control/protection: Surgical, Post-menopausal  ?  Comment: ablation  ?Other Topics Concern  ? Not on file  ?Social History Narrative  ? Not on file  ? ?Social Determinants of Health  ? ?Financial Resource Strain: Not on file  ?Food Insecurity: Not on file  ?Transportation Needs: Not on file  ?Physical Activity: Not on file  ?Stress: Not on file  ?Social Connections: Not on file  ?Intimate Partner Violence: Not on file  ? ? ?Family History  ?Problem Relation Age of Onset  ? ALS Mother   ? Cancer Father   ? Dementia Brother   ? ? ? ?Current Outpatient Medications:  ?  BIOTIN PO, Take 1 Dose by mouth daily. 10,000 is dose, Disp: , Rfl:  ?  calcium carbonate (OS-CAL - DOSED IN MG OF ELEMENTAL CALCIUM) 1250 (500 Ca) MG tablet, Take 1 tablet by mouth. , Disp: , Rfl:  ?  ergocalciferol (VITAMIN D2) 1.25 MG (50000 UT) capsule, Take 50,000 Units by mouth once a week. , Disp: , Rfl:  ?  Multiple Vitamin (MULTIVITAMIN WITH MINERALS) TABS tablet, Take 1 tablet by mouth daily. , Disp: , Rfl:  ?  psyllium (METAMUCIL) 58.6 % packet, Take 1 packet by mouth daily., Disp: , Rfl:  ?  rosuvastatin (CRESTOR) 5 MG tablet, Take 5 mg by mouth daily., Disp: , Rfl:  ?  estradiol (ESTRACE) 0.1 MG/GM vaginal cream, Use .05 gm in vagina at bedtime  2-3 x weekly (Patient not taking: Reported on 10/19/2021), Disp: 42.5 g, Rfl: 1 ? ?Physical exam:  ?Vitals:  ? 10/19/21 1107  ?BP: 109/81  ?Pulse: 75  ?Resp: 16  ?Temp: (!) 97.3 ?F (36.3 ?C)  ?TempSrc: Tympanic  ?Weight: 150 lb  11.2 oz (68.4 kg)  ?Height: 5' 7.72" (1.72 m)  ? ?Physical Exam ?Cardiovascular:  ?   Rate and Rhythm: Normal rate and regular rhythm.  ?   Heart sounds: Normal heart sounds.  ?Pulmonary:  ?   Effort: Pulmonary effort is normal.  ?   Breath sounds: Normal breath sounds.  ?Abdominal:  ?   General: Bowel sounds are normal.  ?   Palpations: Abdomen is soft.  ?   Comments: Digital rectal exam as well as visualization of the anus externally appears normal  ?Lymphadenopathy:  ?   Comments: No palpable inguinal adenopathy  ?Skin: ?   General: Skin is warm and dry.  ?Neurological:  ?   Mental Status: She is alert and oriented to person, place, and time.  ?  ? ? ?  Latest Ref Rng & Units 10/19/2021  ? 10:52 AM  ?CMP  ?Glucose 70 - 99 mg/dL 104    ?BUN 8 - 23  mg/dL 14    ?Creatinine 0.44 - 1.00 mg/dL 0.69    ?Sodium 135 - 145 mmol/L 137    ?Potassium 3.5 - 5.1 mmol/L 4.3    ?Chloride 98 - 111 mmol/L 103    ?CO2 22 - 32 mmol/L 25    ?Calcium 8.9 - 10.3 mg/dL 9.2    ?Total Protein 6.5 - 8.1 g/dL 7.4    ?Total Bilirubin 0.3 - 1.2 mg/dL 0.6    ?Alkaline Phos 38 - 126 U/L 46    ?AST 15 - 41 U/L 26    ?ALT 0 - 44 U/L 22    ? ? ?  Latest Ref Rng & Units 10/19/2021  ? 10:52 AM  ?CBC  ?WBC 4.0 - 10.5 K/uL 4.7    ?Hemoglobin 12.0 - 15.0 g/dL 14.5    ?Hematocrit 36.0 - 46.0 % 43.2    ?Platelets 150 - 400 K/uL 216    ? ? ?No images are attached to the encounter. ? ?CT CHEST ABDOMEN PELVIS W CONTRAST ? ?Result Date: 10/04/2021 ?CLINICAL DATA:  Restaging of anal cancer. * Tracking Code: BO * EXAM: CT CHEST, ABDOMEN, AND PELVIS WITH CONTRAST TECHNIQUE: Multidetector CT imaging of the chest, abdomen and pelvis was performed following the standard protocol during bolus administration of intravenous contrast. RADIATION DOSE REDUCTION: This exam was performed according to the departmental dose-optimization program which includes automated exposure control, adjustment of the mA and/or kV according to patient size and/or use of iterative  reconstruction technique. CONTRAST:  133m OMNIPAQUE IOHEXOL 300 MG/ML  SOLN COMPARISON:  04/22/2021 FINDINGS: CT CHEST FINDINGS Cardiovascular: Mild aortic valve calcification noted. Mediastinum/Nodes: Small type 1 hia

## 2021-10-19 NOTE — Progress Notes (Signed)
She has closed colostomy and she has pain on bending over she can feel part of bowels pushing out making a knot. She massages it and it will get better. She wants something done to help with this issue. She is on wt. Loss program and she take a gtt of medicine and has appetite pill- does not know what it is . She goes to [place] wt loss with her significant other ?

## 2021-10-19 NOTE — Telephone Encounter (Signed)
Confirmed scan appointment on 5/10 and patient confirmed day/time/location and NPO instructions.  ? ? ? ?

## 2021-10-20 ENCOUNTER — Other Ambulatory Visit: Payer: Self-pay

## 2021-10-20 ENCOUNTER — Telehealth: Payer: Self-pay

## 2021-10-20 DIAGNOSIS — C211 Malignant neoplasm of anal canal: Secondary | ICD-10-CM

## 2021-10-20 NOTE — Telephone Encounter (Signed)
-----   Message from [name], MD sent at 10/19/2021  3:53 PM EDT ----- ?This patient needs a sigmoidoscopy for anal cancer. Thanks ?----- Message ----- ?From: Sindy Guadeloupe, MD ?Sent: 10/19/2021   3:31 PM EDT ?To: [name], MD ? ? ?

## 2021-10-20 NOTE — Telephone Encounter (Signed)
Pt has been scheduled for May 9th at [facility]... Instructions emailed to pt per her request and went over the instructions via telephone... Pt expressed understanding ?

## 2021-11-01 ENCOUNTER — Encounter: Payer: Self-pay | Admitting: Gastroenterology

## 2021-11-02 ENCOUNTER — Ambulatory Visit
Admission: RE | Admit: 2021-11-02 | Discharge: 2021-11-02 | Disposition: A | Discharge status: Home / Self Care | Payer: Medicare Other | Source: Ambulatory Visit | Attending: Gastroenterology | Admitting: Gastroenterology

## 2021-11-02 ENCOUNTER — Ambulatory Visit: Payer: Medicare Other | Admitting: Certified Registered"

## 2021-11-02 ENCOUNTER — Encounter
Admission: RE | Disposition: A | Discharge status: Home / Self Care | Payer: Self-pay | Source: Ambulatory Visit | Attending: Gastroenterology

## 2021-11-02 DIAGNOSIS — K627 Radiation proctitis: Secondary | ICD-10-CM | POA: Diagnosis not present

## 2021-11-02 DIAGNOSIS — Z87891 Personal history of nicotine dependence: Secondary | ICD-10-CM | POA: Insufficient documentation

## 2021-11-02 DIAGNOSIS — C211 Malignant neoplasm of anal canal: Secondary | ICD-10-CM | POA: Diagnosis not present

## 2021-11-02 DIAGNOSIS — Y842 Radiological procedure and radiotherapy as the cause of abnormal reaction of the patient, or of later complication, without mention of misadventure at the time of the procedure: Secondary | ICD-10-CM | POA: Diagnosis not present

## 2021-11-02 DIAGNOSIS — Z85048 Personal history of other malignant neoplasm of rectum, rectosigmoid junction, and anus: Secondary | ICD-10-CM | POA: Diagnosis not present

## 2021-11-02 DIAGNOSIS — Z08 Encounter for follow-up examination after completed treatment for malignant neoplasm: Secondary | ICD-10-CM | POA: Diagnosis not present

## 2021-11-02 DIAGNOSIS — K620 Anal polyp: Secondary | ICD-10-CM | POA: Diagnosis not present

## 2021-11-02 HISTORY — PX: FLEXIBLE SIGMOIDOSCOPY: SHX5431

## 2021-11-02 SURGERY — SIGMOIDOSCOPY, FLEXIBLE
Anesthesia: General

## 2021-11-02 MED ORDER — LIDOCAINE HCL (CARDIAC) PF 100 MG/5ML IV SOSY
PREFILLED_SYRINGE | INTRAVENOUS | Status: DC | PRN
  Administered 2021-11-02: 100 mg via INTRAVENOUS

## 2021-11-02 MED ORDER — PROPOFOL 10 MG/ML IV BOLUS
INTRAVENOUS | Status: DC | PRN
  Administered 2021-11-02: 100 mg via INTRAVENOUS
  Administered 2021-11-02: 100 ug/kg/min via INTRAVENOUS

## 2021-11-02 MED ORDER — SODIUM CHLORIDE 0.9 % IV SOLN: INTRAVENOUS | Status: DC

## 2021-11-02 NOTE — Op Note (Signed)
[facility] ?Gastroenterology ?Patient Name: Courtney Gallegos ?Procedure Date: 11/02/2021 10:14 AM ?MRN: [id number] ?Account #: [id number] ?Date of Birth: 06-04-1956 ?Admit Type: Outpatient ?Age: 66 ?Room: [facility] ENDO ROOM 4 ?Gender: Female ?Note Status: Finalized ?Instrument Name: Upper Endoscope [id number] ?Procedure:             Flexible Sigmoidoscopy ?Indications:           High risk colon cancer surveillance: Personal history  ?                       of anal cancer ?Providers:             [name] MD, MD ?Medicines:             Propofol per Anesthesia ?Complications:         No immediate complications. ?Procedure:             Pre-Anesthesia Assessment: ?                       - Prior to the procedure, a History and Physical was  ?                       performed, and patient medications and allergies were  ?                       reviewed. The patient's tolerance of previous  ?                       anesthesia was also reviewed. The risks and benefits  ?                       of the procedure and the sedation options and risks  ?                       were discussed with the patient. All questions were  ?                       answered, and informed consent was obtained. Prior  ?                       Anticoagulants: The patient has taken no previous  ?                       anticoagulant or antiplatelet agents. ASA Grade  ?                       Assessment: II - A patient with mild systemic disease.  ?                       After reviewing the risks and benefits, the patient  ?                       was deemed in satisfactory condition to undergo the  ?                       procedure. ?                       After obtaining informed consent, the scope was passed  ?  under direct vision. The Endoscope was introduced  ?                       through the anus and advanced to the the sigmoid  ?                       colon. The flexible sigmoidoscopy was accomplished  ?                        without difficulty. The patient tolerated the  ?                       procedure well. The quality of the bowel preparation  ?                       was good. ?Findings: ?     The perianal and digital rectal examinations were normal. ?     A polyp was found in the anus. The polyp was sessile. Biopsies were  ?     taken with a cold forceps for histology. ?     Diffuse moderate inflammation characterized by erythema was found in the  ?     rectum. ?Impression:            - One polyp at the anus. Biopsied. ?                       - Diffuse moderate inflammation was found in the  ?                       rectum secondary to radiation proctitis. ?Recommendation:        - Discharge patient to home. ?                       - Resume previous diet. ?                       - Await pathology results. ?Procedure Code(s):     --- Professional --- ?                       [phone], Sigmoidoscopy, flexible; with biopsy, single or  ?                       multiple ?Diagnosis Code(s):     --- Professional --- ?                       T65.465, Personal history of other malignant neoplasm  ?                       of rectum, rectosigmoid junction, and anus ?                       K62.0, Anal polyp ?CPT copyright 2019 American Medical Association. All rights reserved. ?The codes documented in this report are preliminary and upon coder review may  ?be revised to meet current compliance requirements. ?[name] MD, MD ?11/02/2021 10:47:46 AM ?This report has been signed electronically. ?Number of Addenda: 0 ?Note Initiated On: 11/02/2021 10:14 AM ?Estimated Blood Loss:  Estimated blood loss: none. ?     [facility] ?

## 2021-11-02 NOTE — Anesthesia Postprocedure Evaluation (Signed)
Anesthesia Post Note ? ?Patient: Courtney Gallegos ? ?Procedure(s) Performed: FLEXIBLE SIGMOIDOSCOPY ? ?Patient location during evaluation: PACU ?Anesthesia Type: General ?Level of consciousness: awake and alert ?Pain management: pain level controlled ?Vital Signs Assessment: post-procedure vital signs reviewed and stable ?Respiratory status: spontaneous breathing, nonlabored ventilation, respiratory function stable and patient connected to nasal cannula oxygen ?Cardiovascular status: blood pressure returned to baseline and stable ?Postop Assessment: no apparent nausea or vomiting ?Anesthetic complications: no ? ? ?No notable events documented. ? ? ?Last Vitals:  ?Vitals:  ? 11/02/21 1050 11/02/21 1100  ?BP: (!) 93/58 128/76  ?Pulse: 62 63  ?Resp: (!) 9 15  ?Temp: 36.4 ?C   ?SpO2: 100% 100%  ?  ?Last Pain:  ?Vitals:  ? 11/02/21 1100  ?TempSrc:   ?PainSc: 0-No pain  ? ? ?  ?  ?  ?  ?  ?  ? ?Molli Barrows ? ? ? ? ?

## 2021-11-02 NOTE — H&P (Signed)
? ?Courtney Lame, MD [facility] ?[address] ?[place], [place] [address] ?Phone:[phone] ?Fax : [phone] ? ?Primary Care Physician:  Asencion Noble, MD ?Primary Gastroenterologist:  Dr. Allen Norris ? ?Pre-Procedure History & Physical: ?HPI:  Courtney Gallegos is a 66 y.o. female is here for an flexible sigmoidoscopy. ?  ?Past Medical History:  ?Diagnosis Date  ? Anal cancer ([place]) 06/07/2019  ? Hearing deficit, bilateral   ? Mild, has hearing aids, does not wear.  ? Hyperlipidemia 06/07/2019  ? ? ?Past Surgical History:  ?Procedure Laterality Date  ? BREAST ENHANCEMENT SURGERY Bilateral   ? COLONOSCOPY WITH PROPOFOL N/A 04/03/2020  ? Procedure: COLONOSCOPY WITH PROPOFOL;  Surgeon: Courtney Lame, MD;  Location: [place];  Service: Endoscopy;  Laterality: N/A;  Priority 4  ? OSTOMY Left 06/07/2019  ? ? ?Prior to Admission medications   ?Medication Sig Start Date End Date Taking? Authorizing Provider  ?BIOTIN PO Take 1 Dose by mouth daily. 10,000 is dose   Yes [provider]  ?calcium carbonate (OS-CAL - DOSED IN MG OF ELEMENTAL CALCIUM) 1250 (500 Ca) MG tablet Take 1 tablet by mouth.    Yes [provider]  ?ergocalciferol (VITAMIN D2) 1.25 MG (50000 UT) capsule Take 50,000 Units by mouth once a week.    Yes [provider]  ?Multiple Vitamin (MULTIVITAMIN WITH MINERALS) TABS tablet Take 1 tablet by mouth daily.    Yes [provider]  ?psyllium (METAMUCIL) 58.6 % packet Take 1 packet by mouth daily.   Yes [provider]  ?rosuvastatin (CRESTOR) 5 MG tablet Take 5 mg by mouth daily. 10/09/19  Yes [provider]  ?estradiol (ESTRACE) 0.1 MG/GM vaginal cream Use .05 gm in vagina at bedtime  2-3 x weekly ?Patient not taking: Reported on 10/19/2021 02/03/20   Estill Dooms, NP  ? ? ?Allergies as of 10/21/2021  ? (No Known Allergies)  ? ? ?Family History  ?Problem Relation Age of Onset  ? ALS Mother   ? Cancer Father   ? Dementia Brother   ? ? ?Social History   ? ?Socioeconomic History  ? Marital status: Significant Other  ?  Spouse name: Not on file  ? Number of children: 3  ? Years of education: Not on file  ? Highest education level: Not on file  ?Occupational History  ? Occupation: Retired  ?Tobacco Use  ? Smoking status: Former  ?  Years: 2.00  ?  Types: Cigarettes  ?  Quit date: 2012  ?  Years since quitting: 11.3  ? Smokeless tobacco: Never  ? Tobacco comments:  ?  Wine with dinner  ?Vaping Use  ? Vaping Use: Never used  ?Substance and Sexual Activity  ? Alcohol use: Yes  ?  Alcohol/week: 2.0 - 4.0 standard drinks  ?  Types: 1 - 2 Glasses of wine, 1 - 2 Shots of liquor per week  ? Drug use: Never  ? Sexual activity: Not Currently  ?  Birth control/protection: Surgical, Post-menopausal  ?  Comment: ablation  ?Other Topics Concern  ? Not on file  ?Social History Narrative  ? Not on file  ? ?Social Determinants of Health  ? ?Financial Resource Strain: Not on file  ?Food Insecurity: Not on file  ?Transportation Needs: Not on file  ?Physical Activity: Not on file  ?Stress: Not on file  ?Social Connections: Not on file  ?Intimate Partner Violence: Not on file  ? ? ?Review of Systems: ?See HPI, otherwise negative ROS ? ?  Physical Exam: ?There were no vitals taken for this visit. ?General:   Alert,  pleasant and cooperative in NAD ?Head:  Normocephalic and atraumatic. ?Neck:  Supple; no masses or thyromegaly. ?Lungs:  Clear throughout to auscultation.    ?Heart:  Regular rate and rhythm. ?Abdomen:  Soft, nontender and nondistended. Normal bowel sounds, without guarding, and without rebound.   ?Neurologic:  Alert and  oriented x4;  grossly normal neurologically. ? ?Impression/Plan: ?[name] is here for an flexible sigmoidoscopy to be performed for history of anal cancer ? ?Risks, benefits, limitations, and alternatives regarding  flexible sigmoidoscopy have been reviewed with the patient.  Questions have been answered.  All parties agreeable. ? ? ?Courtney Lame, MD   11/02/2021, 10:25 AM ?

## 2021-11-02 NOTE — Transfer of Care (Signed)
Immediate Anesthesia Transfer of Care Note ? ?Patient: Courtney Gallegos ? ?Procedure(s) Performed: FLEXIBLE SIGMOIDOSCOPY ? ?Patient Location: PACU and Endoscopy Unit ? ?Anesthesia Type:General ? ?Level of Consciousness: awake ? ?Airway & Oxygen Therapy: Patient Spontanous Breathing ? ?Post-op Assessment: Report given to RN ? ?Post vital signs: Reviewed and stable ? ?Last Vitals:  ?Vitals Value Taken Time  ?BP 94/62 1050  ?Temp 36 1050  ?Pulse 62 11/02/21 1050  ?Resp 9 11/02/21 1050  ?SpO2 100 % 11/02/21 1050  ?Vitals shown include unvalidated device data. ? ?Last Pain:  ?Vitals:  ? 11/02/21 1025  ?TempSrc: Temporal  ?PainSc: 0-No pain  ?   ? ?  ? ?Complications: No notable events documented. ?

## 2021-11-02 NOTE — Anesthesia Preprocedure Evaluation (Signed)
Anesthesia Evaluation  ?Patient identified by MRN, date of birth, ID band ?Patient awake ? ? ? ?Reviewed: ?Allergy & Precautions, H&P , NPO status , Patient's Chart, lab work & pertinent test results, reviewed documented beta blocker date and time  ? ?Airway ?Mallampati: II ? ? ?Neck ROM: full ? ? ? Dental ? ?(+) Poor Dentition ?  ?Pulmonary ?neg pulmonary ROS, former smoker,  ?  ?Pulmonary exam normal ? ? ? ? ? ? ? Cardiovascular ?Exercise Tolerance: Good ?negative cardio ROS ?Normal cardiovascular exam ?Rhythm:regular Rate:Normal ? ? ?  ?Neuro/Psych ?negative neurological ROS ? negative psych ROS  ? GI/Hepatic ?negative GI ROS, Neg liver ROS,   ?Endo/Other  ?negative endocrine ROS ? Renal/GU ?negative Renal ROS  ?negative genitourinary ?  ?Musculoskeletal ? ? Abdominal ?  ?Peds ? Hematology ?negative hematology ROS ?(+)   ?Anesthesia Other Findings ?Past Medical History: ?06/07/2019: Anal cancer ([place]) ?No date: Hearing deficit, bilateral ?    Comment:  Mild, has hearing aids, does not wear. ?06/07/2019: Hyperlipidemia ?Past Surgical History: ?No date: BREAST ENHANCEMENT SURGERY; Bilateral ?04/03/2020: COLONOSCOPY WITH PROPOFOL; N/A ?    Comment:  Procedure: COLONOSCOPY WITH PROPOFOL;  Surgeon: Allen Norris,  ?             Darren, MD;  Location: [place];  Service:  ?             Endoscopy;  Laterality: N/A;  Priority 4 ?06/07/2019: OSTOMY; Left ? ? Reproductive/Obstetrics ?negative OB ROS ? ?  ? ? ? ? ? ? ? ? ? ? ? ? ? ?  ?  ? ? ? ? ? ? ? ? ?Anesthesia Physical ?Anesthesia Plan ? ?ASA: 2 ? ?Anesthesia Plan: General  ? ?Post-op Pain Management:   ? ?Induction:  ? ?PONV Risk Score and Plan:  ? ?Airway Management Planned:  ? ?Additional Equipment:  ? ?Intra-op Plan:  ? ?Post-operative Plan:  ? ?Informed Consent: I have reviewed the patients History and Physical, chart, labs and discussed the procedure including the risks, benefits and alternatives for the proposed anesthesia with  the patient or authorized representative who has indicated his/her understanding and acceptance.  ? ? ? ?Dental Advisory Given ? ?Plan Discussed with: CRNA ? ?Anesthesia Plan Comments:   ? ? ? ? ? ? ?Anesthesia Quick Evaluation ? ?

## 2021-11-03 ENCOUNTER — Ambulatory Visit (HOSPITAL_COMMUNITY)
Admission: RE | Admit: 2021-11-03 | Discharge: 2021-11-03 | Disposition: A | Discharge status: Home / Self Care | Payer: Medicare Other | Source: Ambulatory Visit | Attending: Oncology | Admitting: Oncology

## 2021-11-03 ENCOUNTER — Encounter: Payer: Self-pay | Admitting: Gastroenterology

## 2021-11-03 DIAGNOSIS — Z08 Encounter for follow-up examination after completed treatment for malignant neoplasm: Secondary | ICD-10-CM | POA: Diagnosis present

## 2021-11-03 DIAGNOSIS — Z85048 Personal history of other malignant neoplasm of rectum, rectosigmoid junction, and anus: Secondary | ICD-10-CM | POA: Diagnosis present

## 2021-11-03 LAB — SURGICAL PATHOLOGY

## 2021-11-03 MED ORDER — GADOBUTROL 1 MMOL/ML IV SOLN
6.0000 mL | Freq: Once | INTRAVENOUS | Status: AC | PRN
  Administered 2021-11-03: 6 mL via INTRAVENOUS

## 2022-04-20 ENCOUNTER — Ambulatory Visit: Payer: Medicare Other | Admitting: Oncology

## 2022-04-20 ENCOUNTER — Other Ambulatory Visit: Payer: Medicare Other

## 2022-05-10 ENCOUNTER — Inpatient Hospital Stay (HOSPITAL_BASED_OUTPATIENT_CLINIC_OR_DEPARTMENT_OTHER): Payer: Medicare Other | Admitting: Oncology

## 2022-05-10 ENCOUNTER — Encounter: Payer: Self-pay | Admitting: Oncology

## 2022-05-10 ENCOUNTER — Inpatient Hospital Stay: Payer: Medicare Other | Attending: Oncology

## 2022-05-10 VITALS — BP 122/77 | HR 60 | Temp 98.3°F | Resp 16 | Wt 144.1 lb

## 2022-05-10 DIAGNOSIS — Z85048 Personal history of other malignant neoplasm of rectum, rectosigmoid junction, and anus: Secondary | ICD-10-CM | POA: Insufficient documentation

## 2022-05-10 DIAGNOSIS — Z08 Encounter for follow-up examination after completed treatment for malignant neoplasm: Secondary | ICD-10-CM

## 2022-05-10 DIAGNOSIS — Z87891 Personal history of nicotine dependence: Secondary | ICD-10-CM | POA: Insufficient documentation

## 2022-05-10 DIAGNOSIS — Z79899 Other long term (current) drug therapy: Secondary | ICD-10-CM | POA: Insufficient documentation

## 2022-05-10 LAB — COMPREHENSIVE METABOLIC PANEL
ALT: 18 U/L (ref 0–44)
AST: 23 U/L (ref 15–41)
Albumin: 4.6 g/dL (ref 3.5–5.0)
Alkaline Phosphatase: 53 U/L (ref 38–126)
Anion gap: 9 (ref 5–15)
BUN: 18 mg/dL (ref 8–23)
CO2: 26 mmol/L (ref 22–32)
Calcium: 9.3 mg/dL (ref 8.9–10.3)
Chloride: 102 mmol/L (ref 98–111)
Creatinine, Ser: 0.72 mg/dL (ref 0.44–1.00)
GFR, Estimated: >60 mL/min (ref >60)
Glucose, Bld: 110 mg/dL — ABNORMAL HIGH (ref 70–99)
Potassium: 3.9 mmol/L (ref 3.5–5.1)
Sodium: 137 mmol/L (ref 135–145)
Total Bilirubin: 0.5 mg/dL (ref 0.3–1.2)
Total Protein: 7.8 g/dL (ref 6.5–8.1)

## 2022-05-10 LAB — CBC WITH DIFFERENTIAL/PLATELET
Abs Immature Granulocytes: 0.02 10*3/uL (ref 0.00–0.07)
Basophils Absolute: 0 10*3/uL (ref 0.0–0.1)
Basophils Relative: 1 %
Eosinophils Absolute: 0.2 10*3/uL (ref 0.0–0.5)
Eosinophils Relative: 3 %
HCT: 41 % (ref 36.0–46.0)
Hemoglobin: 13.8 g/dL (ref 12.0–15.0)
Immature Granulocytes: 0 %
Lymphocytes Relative: 26 %
Lymphs Abs: 1.5 10*3/uL (ref 0.7–4.0)
MCH: 32.7 pg (ref 26.0–34.0)
MCHC: 33.7 g/dL (ref 30.0–36.0)
MCV: 97.2 fL (ref 80.0–100.0)
Monocytes Absolute: 0.5 10*3/uL (ref 0.1–1.0)
Monocytes Relative: 8 %
Neutro Abs: 3.7 10*3/uL (ref 1.7–7.7)
Neutrophils Relative %: 62 %
Platelets: 219 10*3/uL (ref 150–400)
RBC: 4.22 MIL/uL (ref 3.87–5.11)
RDW: 12.4 % (ref 11.5–15.5)
WBC: 5.9 10*3/uL (ref 4.0–10.5)
nRBC: 0 % (ref 0.0–0.2)

## 2022-05-10 NOTE — Progress Notes (Signed)
Hematology/Oncology Consult note [facility]  [phone] [phone]  Patient Care Team: Asencion Noble, MD as PCP - General (Internal Medicine) [name], MD as Consulting Physician (Gastroenterology) Sindy Guadeloupe, MD as Consulting Physician (Hematology and Oncology)   Name of the patient: Courtney Gallegos  [id number]  Feb 21, 1956   Date of visit: 05/10/22  Diagnosis- history of anal cancer in 2019    Chief complaint/ Reason for visit-routine surveillance visit for anal cancer  Heme/Onc history: Patient is a 66 year old female who waS/p biopsy in October 2019.  Pathology showed moderately differentiated squamous cell carcinoma p16 positive.s diagnosed withStage III anal squamous cell carcinoma. MRI of the pelvis on 03/25/2018 revealed a 3.2 cm palpable perineal mass invading the distal one third of the posterior vaginal wall extending to the introitus, the adjacent vaginal rectal perineum and the anterior anus extending in close proximity to the rectal anal junction craniocaudally to the introitus and appeared to extend from the 9:00 to 3 o'clock position.   PET scan on 05/17/2018 revealed focal anal rectal uptake consistent with her history of malignancy.  There was no hypermetabolic lymph nodes in the abdomen or pelvis.  There was no evidence of metastatic disease.   She underwent laparoscopic diverting colostomy on 06/05/2018.  She received 2 cycles of capecitabine + mitomycin (07/02/2018 - 07/30/2018) with concurrent radiation  (07/02/2018 - 08/10/2018).  She underwent colostomy reversal on 02/11/2019.   She enrolled on clinical trial [id number] available through the [facility] A:  A randomized phase 2 study of nivolumab after combined modality therapy in high risk anal cancer.  She received 4 cycles of nivolumab (09/19/2018 - 12/12/2018).     Patient has been in remission since then  Interval history-patient is doing well  currently and is in the process of moving to [facility].  Bowel movements are regular.  Appetite is good and she is actively working on losing weight.  She does report pain at the site of her prior stoma which was complicated by parastomal hernia  ECOG PS- 0 Pain scale- 0  Review of systems- Review of Systems  Constitutional:  Negative for chills, fever, malaise/fatigue and weight loss.  HENT:  Negative for congestion, ear discharge and nosebleeds.   Eyes:  Negative for blurred vision.  Respiratory:  Negative for cough, hemoptysis, sputum production, shortness of breath and wheezing.   Cardiovascular:  Negative for chest pain, palpitations, orthopnea and claudication.  Gastrointestinal:  Negative for abdominal pain, blood in stool, constipation, diarrhea, heartburn, melena, nausea and vomiting.  Genitourinary:  Negative for dysuria, flank pain, frequency, hematuria and urgency.  Musculoskeletal:  Negative for back pain, joint pain and myalgias.  Skin:  Negative for rash.  Neurological:  Negative for dizziness, tingling, focal weakness, seizures, weakness and headaches.  Endo/Heme/Allergies:  Does not bruise/bleed easily.  Psychiatric/Behavioral:  Negative for depression and suicidal ideas. The patient does not have insomnia.       No Known Allergies   Past Medical History:  Diagnosis Date   Anal cancer ([place]) 06/07/2019   Hearing deficit, bilateral    Mild, has hearing aids, does not wear.   Hyperlipidemia 06/07/2019     Past Surgical History:  Procedure Laterality Date   BREAST ENHANCEMENT SURGERY Bilateral    COLONOSCOPY WITH PROPOFOL N/A 04/03/2020   Procedure: COLONOSCOPY WITH PROPOFOL;  Surgeon: [name], MD;  Location: [place];  Service: Endoscopy;  Laterality: N/A;  Priority 4  FLEXIBLE SIGMOIDOSCOPY N/A 11/02/2021   Procedure: FLEXIBLE SIGMOIDOSCOPY;  Surgeon: [name], MD;  Location: [facility] ENDOSCOPY;  Service: Endoscopy;  Laterality: N/A;    OSTOMY Left 06/07/2019    Social History   Socioeconomic History   Marital status: Significant Other    Spouse name: Not on file   Number of children: 3   Years of education: Not on file   Highest education level: Not on file  Occupational History   Occupation: Retired  Tobacco Use   Smoking status: Former    Years: 2.00    Types: Cigarettes    Quit date: 2012    Years since quitting: 11.8   Smokeless tobacco: Never   Tobacco comments:    Wine with dinner  Vaping Use   Vaping Use: Never used  Substance and Sexual Activity   Alcohol use: Yes    Alcohol/week: 2.0 - 4.0 standard drinks of alcohol    Types: 1 - 2 Glasses of wine, 1 - 2 Shots of liquor per week   Drug use: Never   Sexual activity: Not Currently    Birth control/protection: Surgical, Post-menopausal    Comment: ablation  Other Topics Concern   Not on file  Social History Narrative   Not on file   Social Determinants of Health   Financial Resource Strain: Low Risk  (11/05/2019)   Overall Financial Resource Strain (CARDIA)    Difficulty of Paying Living Expenses: Not hard at all  Food Insecurity: No Food Insecurity (11/05/2019)   Hunger Vital Sign    Worried About Running Out of Food in the Last Year: Never true    Ran Out of Food in the Last Year: Never true  Transportation Needs: No Transportation Needs (11/05/2019)   PRAPARE - Hydrologist (Medical): No    Lack of Transportation (Non-Medical): No  Physical Activity: Insufficiently Active (11/05/2019)   Exercise Vital Sign    Days of Exercise per Week: 3 days    Minutes of Exercise per Session: 30 min  Stress: No Stress Concern Present (11/05/2019)   [place]    Feeling of Stress : Not at all  Social Connections: Unknown (11/05/2019)   Social Connection and Isolation Panel [NHANES]    Frequency of Communication with Friends and Family: More than three  times a week    Frequency of Social Gatherings with Friends and Family: More than three times a week    Attends Religious Services: Patient refused    Active Member of Clubs or Organizations: Patient refused    Attends Music therapist: More than 4 times per year    Marital Status: Living with partner  Intimate Partner Violence: Not At Risk (11/05/2019)   Humiliation, Afraid, Rape, and Kick questionnaire    Fear of Current or Ex-Partner: No    Emotionally Abused: No    Physically Abused: No    Sexually Abused: No    Family History  Problem Relation Age of Onset   ALS Mother    Cancer Father    Dementia Brother      Current Outpatient Medications:    BIOTIN PO, Take 1 Dose by mouth daily. 10,000 is dose, Disp: , Rfl:    calcium carbonate (OS-CAL - DOSED IN MG OF ELEMENTAL CALCIUM) 1250 (500 Ca) MG tablet, Take 1 tablet by mouth. , Disp: , Rfl:    ergocalciferol (VITAMIN D2) 1.25 MG (50000 UT) capsule, Take 50,000 Units  by mouth once a week. , Disp: , Rfl:    Multiple Vitamin (MULTIVITAMIN WITH MINERALS) TABS tablet, Take 1 tablet by mouth daily. , Disp: , Rfl:    psyllium (METAMUCIL) 58.6 % packet, Take 1 packet by mouth daily., Disp: , Rfl:    rosuvastatin (CRESTOR) 5 MG tablet, Take 5 mg by mouth daily., Disp: , Rfl:    estradiol (ESTRACE) 0.1 MG/GM vaginal cream, Use .05 gm in vagina at bedtime  2-3 x weekly (Patient not taking: Reported on 10/19/2021), Disp: 42.5 g, Rfl: 1  Physical exam:  Vitals:   05/10/22 1432  BP: 122/77  Pulse: 60  Resp: 16  Temp: 98.3 F (36.8 C)  SpO2: 100%  Weight: 144 lb 1.6 oz (65.4 kg)   Physical Exam Cardiovascular:     Rate and Rhythm: Normal rate and regular rhythm.     Heart sounds: Normal heart sounds.  Pulmonary:     Effort: Pulmonary effort is normal.     Breath sounds: Normal breath sounds.  Abdominal:     General: Bowel sounds are normal.     Palpations: Abdomen is soft.     Comments: Rectal exam normal with no  palpable masses  Lymphadenopathy:     Comments: No palpable inguinal adenopathy  Skin:    General: Skin is warm and dry.  Neurological:     Mental Status: She is alert and oriented to person, place, and time.         Latest Ref Rng & Units 05/10/2022    2:12 PM  CMP  Glucose 70 - 99 mg/dL 110   BUN 8 - 23 mg/dL 18   Creatinine 0.44 - 1.00 mg/dL 0.72   Sodium 135 - 145 mmol/L 137   Potassium 3.5 - 5.1 mmol/L 3.9   Chloride 98 - 111 mmol/L 102   CO2 22 - 32 mmol/L 26   Calcium 8.9 - 10.3 mg/dL 9.3   Total Protein 6.5 - 8.1 g/dL 7.8   Total Bilirubin 0.3 - 1.2 mg/dL 0.5   Alkaline Phos 38 - 126 U/L 53   AST 15 - 41 U/L 23   ALT 0 - 44 U/L 18       Latest Ref Rng & Units 05/10/2022    2:12 PM  CBC  WBC 4.0 - 10.5 K/uL 5.9   Hemoglobin 12.0 - 15.0 g/dL 13.8   Hematocrit 36.0 - 46.0 % 41.0   Platelets 150 - 400 K/uL 219      Assessment and plan- Patient is a 66 y.o. female  with history of stage III anal cancer s/p concurrent chemoradiation ending in February  2020.  She is here for routine follow-up  Patient is now nearing 4 years of surveillance.  Clinically she is doing well with no concerning signs and symptoms of recurrence based on today's exam.  She does not require any surveillance imaging since she is more than 3 years out of her diagnosis.  She also had a flexible sigmoidoscopy in May 2023 which did not reveal any evidence of malignancy.  She will still continue to follow-up with medical oncology every 6 months to complete 5 years of surveillance.  She is moving to [facility] and we will see if we can find her medical oncology care at [facility].  I will tentatively see her back in 6 months with labs but if she is established at [facility] she can continue to follow-up with them   Visit Diagnosis 1.  Encounter for follow-up surveillance of anal cancer      Dr. Randa Evens, MD, MPH [facility] [phone] 05/10/2022 5:15  PM

## 2022-05-13 ENCOUNTER — Telehealth: Payer: Self-pay

## 2022-05-13 DIAGNOSIS — C211 Malignant neoplasm of anal canal: Secondary | ICD-10-CM

## 2022-05-13 NOTE — Telephone Encounter (Signed)
Printing records to transfer care to [facility].

## 2022-05-13 NOTE — Telephone Encounter (Signed)
Sent referral off to [facility] to [phone] and have receieved a fax confirmation for transfer of care.

## 2022-11-09 ENCOUNTER — Ambulatory Visit: Payer: Medicare Other | Admitting: Oncology

## 2022-11-11 ENCOUNTER — Ambulatory Visit: Payer: Medicare Other | Admitting: Oncology
